# Patient Record
Sex: Female | Born: 1960 | Race: White | Hispanic: No | Marital: Married | Smoking: Former smoker
Health system: Southern US, Community
[De-identification: ages and names within clinical notes are randomized; demographics above are authoritative.]

## PROBLEM LIST (undated history)

## (undated) DIAGNOSIS — E785 Hyperlipidemia, unspecified: Secondary | ICD-10-CM

## (undated) DIAGNOSIS — Z87442 Personal history of urinary calculi: Secondary | ICD-10-CM

## (undated) DIAGNOSIS — D494 Neoplasm of unspecified behavior of bladder: Secondary | ICD-10-CM

## (undated) DIAGNOSIS — C50919 Malignant neoplasm of unspecified site of unspecified female breast: Secondary | ICD-10-CM

## (undated) DIAGNOSIS — C801 Malignant (primary) neoplasm, unspecified: Secondary | ICD-10-CM

## (undated) HISTORY — PX: TUBAL LIGATION: SHX77

## (undated) HISTORY — PX: KIDNEY CYST REMOVAL: SHX684

## (undated) HISTORY — DX: Malignant neoplasm of unspecified site of unspecified female breast: C50.919

---
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
## 2004-09-17 ENCOUNTER — Ambulatory Visit: Payer: Self-pay | Admitting: General Practice

## 2005-10-20 ENCOUNTER — Ambulatory Visit: Payer: Self-pay | Admitting: General Practice

## 2005-10-23 ENCOUNTER — Ambulatory Visit: Payer: Self-pay | Admitting: Endocrinology

## 2006-11-16 ENCOUNTER — Ambulatory Visit: Payer: Self-pay | Admitting: Endocrinology

## 2008-01-26 ENCOUNTER — Ambulatory Visit: Payer: Self-pay | Admitting: Obstetrics and Gynecology

## 2011-01-28 ENCOUNTER — Other Ambulatory Visit (HOSPITAL_COMMUNITY): Payer: Self-pay | Admitting: Obstetrics and Gynecology

## 2011-01-28 DIAGNOSIS — Z139 Encounter for screening, unspecified: Secondary | ICD-10-CM

## 2011-02-02 ENCOUNTER — Ambulatory Visit (HOSPITAL_COMMUNITY)
Admission: RE | Admit: 2011-02-02 | Discharge: 2011-02-02 | Disposition: A | Discharge status: Home / Self Care | Payer: 59 | Source: Ambulatory Visit | Attending: Obstetrics and Gynecology | Admitting: Obstetrics and Gynecology

## 2011-02-02 DIAGNOSIS — Z1231 Encounter for screening mammogram for malignant neoplasm of breast: Secondary | ICD-10-CM | POA: Insufficient documentation

## 2011-02-02 DIAGNOSIS — Z139 Encounter for screening, unspecified: Secondary | ICD-10-CM

## 2015-06-12 DIAGNOSIS — Z01419 Encounter for gynecological examination (general) (routine) without abnormal findings: Secondary | ICD-10-CM | POA: Diagnosis not present

## 2015-06-12 DIAGNOSIS — E7800 Pure hypercholesterolemia, unspecified: Secondary | ICD-10-CM | POA: Diagnosis not present

## 2015-06-12 DIAGNOSIS — Z1211 Encounter for screening for malignant neoplasm of colon: Secondary | ICD-10-CM | POA: Diagnosis not present

## 2015-06-28 DIAGNOSIS — E7800 Pure hypercholesterolemia, unspecified: Secondary | ICD-10-CM | POA: Diagnosis not present

## 2015-06-28 DIAGNOSIS — Z01419 Encounter for gynecological examination (general) (routine) without abnormal findings: Secondary | ICD-10-CM | POA: Diagnosis not present

## 2015-07-01 MED FILL — SIMVASTATIN 20 MG TABLET: 20 | 90 days supply | Qty: 90 | Fill #0

## 2015-09-30 MED FILL — SIMVASTATIN 20 MG TABLET: 20 | 90 days supply | Qty: 90 | Fill #1

## 2015-10-29 ENCOUNTER — Encounter: Payer: Self-pay | Admitting: *Deleted

## 2015-10-29 ENCOUNTER — Emergency Department: Payer: 59

## 2015-10-29 ENCOUNTER — Emergency Department
Admission: EM | Admit: 2015-10-29 | Discharge: 2015-10-29 | Disposition: A | Discharge status: Home / Self Care | Payer: 59 | Source: Home / Self Care | Attending: Emergency Medicine | Admitting: Emergency Medicine

## 2015-10-29 DIAGNOSIS — N132 Hydronephrosis with renal and ureteral calculous obstruction: Secondary | ICD-10-CM | POA: Diagnosis not present

## 2015-10-29 DIAGNOSIS — I10 Essential (primary) hypertension: Secondary | ICD-10-CM | POA: Insufficient documentation

## 2015-10-29 DIAGNOSIS — N136 Pyonephrosis: Secondary | ICD-10-CM | POA: Diagnosis not present

## 2015-10-29 DIAGNOSIS — B964 Proteus (mirabilis) (morganii) as the cause of diseases classified elsewhere: Secondary | ICD-10-CM | POA: Diagnosis not present

## 2015-10-29 DIAGNOSIS — Z87442 Personal history of urinary calculi: Secondary | ICD-10-CM | POA: Diagnosis not present

## 2015-10-29 DIAGNOSIS — N19 Unspecified kidney failure: Secondary | ICD-10-CM | POA: Diagnosis not present

## 2015-10-29 DIAGNOSIS — N202 Calculus of kidney with calculus of ureter: Secondary | ICD-10-CM | POA: Diagnosis not present

## 2015-10-29 DIAGNOSIS — R000 Tachycardia, unspecified: Secondary | ICD-10-CM | POA: Diagnosis not present

## 2015-10-29 DIAGNOSIS — A4189 Other specified sepsis: Secondary | ICD-10-CM | POA: Diagnosis not present

## 2015-10-29 DIAGNOSIS — I129 Hypertensive chronic kidney disease with stage 1 through stage 4 chronic kidney disease, or unspecified chronic kidney disease: Secondary | ICD-10-CM | POA: Diagnosis not present

## 2015-10-29 DIAGNOSIS — A419 Sepsis, unspecified organism: Secondary | ICD-10-CM | POA: Diagnosis not present

## 2015-10-29 DIAGNOSIS — N201 Calculus of ureter: Secondary | ICD-10-CM | POA: Diagnosis not present

## 2015-10-29 DIAGNOSIS — N183 Chronic kidney disease, stage 3 (moderate): Secondary | ICD-10-CM | POA: Diagnosis not present

## 2015-10-29 DIAGNOSIS — D494 Neoplasm of unspecified behavior of bladder: Secondary | ICD-10-CM | POA: Diagnosis not present

## 2015-10-29 DIAGNOSIS — N200 Calculus of kidney: Secondary | ICD-10-CM

## 2015-10-29 DIAGNOSIS — R51 Headache: Secondary | ICD-10-CM | POA: Diagnosis not present

## 2015-10-29 DIAGNOSIS — R1031 Right lower quadrant pain: Secondary | ICD-10-CM | POA: Diagnosis not present

## 2015-10-29 LAB — URINALYSIS COMPLETE WITH MICROSCOPIC (ARMC ONLY)
BILIRUBIN URINE: NEGATIVE
GLUCOSE, UA: NEGATIVE mg/dL
NITRITE: NEGATIVE
Protein, ur: 100 mg/dL — AB
Specific Gravity, Urine: 1.023 (ref 1.005–1.030)
pH: 5 (ref 5.0–8.0)

## 2015-10-29 MED ORDER — KETOROLAC TROMETHAMINE 30 MG/ML IJ SOLN
30.0000 mg | Freq: Once | INTRAMUSCULAR | Status: AC
  Administered 2015-10-29: 30 mg via INTRAVENOUS

## 2015-10-29 MED ORDER — SODIUM CHLORIDE 0.9 % IV BOLUS (SEPSIS)
1000.0000 mL | Freq: Once | INTRAVENOUS | Status: AC
  Administered 2015-10-29: 1000 mL via INTRAVENOUS

## 2015-10-29 MED ORDER — MORPHINE SULFATE (PF) 4 MG/ML IV SOLN
4.0000 mg | Freq: Once | INTRAVENOUS | Status: AC
  Administered 2015-10-29: 4 mg via INTRAVENOUS
  Filled 2015-10-29: qty 1

## 2015-10-29 MED ORDER — OXYCODONE-ACETAMINOPHEN 5-325 MG PO TABS
ORAL_TABLET | ORAL | Status: AC
  Administered 2015-10-29: 3 via ORAL
  Filled 2015-10-29: qty 3

## 2015-10-29 MED ORDER — KETOROLAC TROMETHAMINE 10 MG PO TABS: 10.0000 mg | ORAL_TABLET | Freq: Four times a day (QID) | ORAL | Status: DC | PRN

## 2015-10-29 MED ORDER — TAMSULOSIN HCL 0.4 MG PO CAPS: 0.4000 mg | ORAL_CAPSULE | Freq: Every day | ORAL | Status: DC

## 2015-10-29 MED ORDER — OXYCODONE-ACETAMINOPHEN 5-325 MG PO TABS: 1.0000 | ORAL_TABLET | ORAL | Status: DC | PRN

## 2015-10-29 MED ORDER — ONDANSETRON HCL 4 MG/2ML IJ SOLN
4.0000 mg | Freq: Once | INTRAMUSCULAR | Status: AC
  Administered 2015-10-29: 4 mg via INTRAVENOUS
  Filled 2015-10-29: qty 2

## 2015-10-29 MED ORDER — KETOROLAC TROMETHAMINE 30 MG/ML IJ SOLN
INTRAMUSCULAR | Status: AC
  Filled 2015-10-29: qty 1

## 2015-10-29 MED ORDER — MORPHINE SULFATE (PF) 4 MG/ML IV SOLN
4.0000 mg | Freq: Once | INTRAVENOUS | Status: AC
  Administered 2015-10-29: 4 mg via INTRAVENOUS

## 2015-10-29 MED ORDER — MORPHINE SULFATE (PF) 4 MG/ML IV SOLN
INTRAVENOUS | Status: AC
  Filled 2015-10-29: qty 1

## 2015-10-29 MED ORDER — OXYCODONE-ACETAMINOPHEN 5-325 MG PO TABS
3.0000 | ORAL_TABLET | Freq: Once | ORAL | Status: AC
  Administered 2015-10-29: 3 via ORAL

## 2015-10-29 NOTE — Discharge Instructions (Signed)
Kidney Stones Kidney stones (urolithiasis) are deposits that form inside your kidneys. The intense pain is caused by the stone moving through the urinary tract. When the stone moves, the ureter goes into spasm around the stone. The stone is usually passed in the urine.  CAUSES   A disorder that makes certain neck glands produce too much parathyroid hormone (primary hyperparathyroidism).  A buildup of uric acid crystals, similar to gout in your joints.  Narrowing (stricture) of the ureter.  A kidney obstruction present at birth (congenital obstruction).  Previous surgery on the kidney or ureters.  Numerous kidney infections. SYMPTOMS   Feeling sick to your stomach (nauseous).  Throwing up (vomiting).  Blood in the urine (hematuria).  Pain that usually spreads (radiates) to the groin.  Frequency or urgency of urination. DIAGNOSIS   Taking a history and physical exam.  Blood or urine tests.  CT scan.  Occasionally, an examination of the inside of the urinary bladder (cystoscopy) is performed. TREATMENT   Observation.  Increasing your fluid intake.  Extracorporeal shock wave lithotripsy--This is a noninvasive procedure that uses shock waves to break up kidney stones.  Surgery may be needed if you have severe pain or persistent obstruction. There are various surgical procedures. Most of the procedures are performed with the use of small instruments. Only small incisions are needed to accommodate these instruments, so recovery time is minimized. The size, location, and chemical composition are all important variables that will determine the proper choice of action for you. Talk to your health care provider to better understand your situation so that you will minimize the risk of injury to yourself and your kidney.  HOME CARE INSTRUCTIONS   Drink enough water and fluids to keep your urine clear or pale yellow. This will help you to pass the stone or stone fragments.  Strain  all urine through the provided strainer. Keep all particulate matter and stones for your health care provider to see. The stone causing the pain may be as small as a grain of salt. It is very important to use the strainer each and every time you pass your urine. The collection of your stone will allow your health care provider to analyze it and verify that a stone has actually passed. The stone analysis will often identify what you can do to reduce the incidence of recurrences.  Only take over-the-counter or prescription medicines for pain, discomfort, or fever as directed by your health care provider.  Keep all follow-up visits as told by your health care provider. This is important.  Get follow-up X-rays if required. The absence of pain does not always mean that the stone has passed. It may have only stopped moving. If the urine remains completely obstructed, it can cause loss of kidney function or even complete destruction of the kidney. It is your responsibility to make sure X-rays and follow-ups are completed. Ultrasounds of the kidney can show blockages and the status of the kidney. Ultrasounds are not associated with any radiation and can be performed easily in a matter of minutes.  Make changes to your daily diet as told by your health care provider. You may be told to:  Limit the amount of salt that you eat.  Eat 5 or more servings of fruits and vegetables each day.  Limit the amount of meat, poultry, fish, and eggs that you eat.  Collect a 24-hour urine sample as told by your health care provider.You may need to collect another urine sample every 6-12   months. SEEK MEDICAL CARE IF:  You experience pain that is progressive and unresponsive to any pain medicine you have been prescribed. SEEK IMMEDIATE MEDICAL CARE IF:   Pain cannot be controlled with the prescribed medicine.  You have a fever or shaking chills.  The severity or intensity of pain increases over 18 hours and is not  relieved by pain medicine.  You develop a new onset of abdominal pain.  You feel faint or pass out.  You are unable to urinate.   This information is not intended to replace advice given to you by your health care provider. Make sure you discuss any questions you have with your health care provider.   Document Released: 04/13/2005 Document Revised: 01/02/2015 Document Reviewed: 09/14/2012 Elsevier Interactive Patient Education 2016 [employer] Dietary Guidelines to Help Prevent Kidney Stones Your risk of kidney stones can be decreased by adjusting the foods you eat. The most important thing you can do is drink enough fluid. You should drink enough fluid to keep your urine clear or pale yellow. The following guidelines provide specific information for the type of kidney stone you have had. GUIDELINES ACCORDING TO TYPE OF KIDNEY STONE Calcium Oxalate Kidney Stones  Reduce the amount of salt you eat. Foods that have a lot of salt cause your body to release excess calcium into your urine. The excess calcium can combine with a substance called oxalate to form kidney stones.  Reduce the amount of animal protein you eat if the amount you eat is excessive. Animal protein causes your body to release excess calcium into your urine. Ask your dietitian how much protein from animal sources you should be eating.  Avoid foods that are high in oxalates. If you take vitamins, they should have less than 500 mg of vitamin C. Your body turns vitamin C into oxalates. You do not need to avoid fruits and vegetables high in vitamin C. Calcium Phosphate Kidney Stones  Reduce the amount of salt you eat to help prevent the release of excess calcium into your urine.  Reduce the amount of animal protein you eat if the amount you eat is excessive. Animal protein causes your body to release excess calcium into your urine. Ask your dietitian how much protein from animal sources you should be eating.  Get enough  calcium from food or take a calcium supplement (ask your dietitian for recommendations). Food sources of calcium that do not increase your risk of kidney stones include:  Broccoli.  Dairy products, such as cheese and yogurt.  Pudding. Uric Acid Kidney Stones  Do not have more than 6 oz of animal protein per day. FOOD SOURCES Animal Protein Sources  Meat (all types).  Poultry.  Eggs.  Fish, seafood. Foods High in Salt  Salt seasonings.  Soy sauce.  Teriyaki sauce.  Cured and processed meats.  Salted crackers and snack foods.  Fast food.  Canned soups and most canned foods. Foods High in Oxalates  Grains:  Amaranth.  Barley.  Grits.  Wheat germ.  Bran.  Buckwheat flour.  All bran cereals.  Pretzels.  Whole wheat bread.  Vegetables:  Beans (wax).  Beets and beet greens.  Collard greens.  Eggplant.  Escarole.  Leeks.  Okra.  Parsley.  Rutabagas.  Spinach.  Swiss chard.  Tomato paste.  Fried potatoes.  Sweet potatoes.  Fruits:  Red currants.  Figs.  Kiwi.  Rhubarb.  Meat and Other Protein Sources:  Beans (dried).  Soy burgers and other soybean products.  Miso.    Nuts (peanuts, almonds, pecans, cashews, hazelnuts).  Nut butters.  Sesame seeds and tahini (paste made of sesame seeds).  Poppy seeds.  Beverages:  Chocolate drink mixes.  Soy milk.  Instant iced tea.  Juices made from high-oxalate fruits or vegetables.  Other:  Carob.  Chocolate.  Fruitcake.  Marmalades.   This information is not intended to replace advice given to you by your health care provider. Make sure you discuss any questions you have with your health care provider.   Document Released: 08/08/2010 Document Revised: 04/18/2013 Document Reviewed: 03/10/2013 Elsevier Interactive Patient Education 2016 Elsevier Inc.  

## 2015-10-29 NOTE — ED Notes (Signed)
States right flank pain that began today around 11 am, states odorous urine, denies any hx of kidney stones, states urinary urgency and dribbiling

## 2015-10-29 NOTE — ED Provider Notes (Signed)
[facility] Emergency Department Provider Note  ____________________________________________  Time seen: Approximately 6:25 PM  I have reviewed the triage vital signs and the nursing notes.   HISTORY  Chief Complaint Flank Pain    HPI Rhonda Schneider is a 55 y.o. female who presents emergency department complaining of right flank pain 1 day. Patient states that the pain is sharp, constant, severe. Patient denies any history of kidney stones. Patient reports that she does have urinary urgency but has decreased volume each time. Patient denies any fevers or chills, abdominal pain. Patient has seen some blood in the urine. Patient is postmenopausal and sexually active only with her husband.   History reviewed. No pertinent past medical history.  There are no active problems to display for this patient.   No past surgical history on file.  Current Outpatient Rx  Name  Route  Sig  Dispense  Refill  . ketorolac (TORADOL) 10 MG tablet   Oral   Take 1 tablet (10 mg total) by mouth every 6 (six) hours as needed.   20 tablet   0   . oxyCODONE-acetaminophen (ROXICET) 5-325 MG tablet   Oral   Take 1 tablet by mouth every 4 (four) hours as needed for severe pain.   24 tablet   0   . tamsulosin (FLOMAX) 0.4 MG CAPS capsule   Oral   Take 1 capsule (0.4 mg total) by mouth daily.   15 capsule   0     Allergies Review of patient's allergies indicates no known allergies.  History reviewed. No pertinent family history.  Social History Social History  Substance Use Topics  . Smoking status: None  . Smokeless tobacco: None  . Alcohol Use: None     Review of Systems  Constitutional: No fever/chills Cardiovascular: no chest pain. Respiratory: no cough. No SOB. Gastrointestinal: No abdominal pain.  No nausea, no vomiting.  No diarrhea.  No constipation. Genitourinary: Negative for dysuria. Positive for polyuria. Positive for hematuria. Positive for  right-sided flank pain. Musculoskeletal: Negative for musculoskeletal pain. Skin: Negative for rash, abrasions, lacerations, ecchymosis. Neurological: Negative for headaches, focal weakness or numbness. 10-point ROS otherwise negative.  ____________________________________________   PHYSICAL EXAM:  VITAL SIGNS: ED Triage Vitals  Enc Vitals Group     BP 10/29/15 1753 154/78 mmHg     Pulse Rate 10/29/15 1753 83     Resp 10/29/15 1753 18     Temp 10/29/15 1753 98.2 F (36.8 C)     Temp Source 10/29/15 1753 Oral     SpO2 10/29/15 1753 98 %     Weight 10/29/15 1753 239 lb (108.41 kg)     Height 10/29/15 1753 5\' 5"  (1.651 m)     Head Cir --      Peak Flow --      Pain Score 10/29/15 1754 8     Pain Loc --      Pain Edu? --      Excl. in [place]? --      Constitutional: Alert and oriented. Well appearing and in no acute distress. Eyes: Conjunctivae are normal. PERRL. EOMI. Head: Atraumatic. Cardiovascular: Normal rate, regular rhythm. Normal S1 and S2.  Good peripheral circulation. Respiratory: Normal respiratory effort without tachypnea or retractions. Lungs CTAB. Good air entry to the bases with no decreased or absent breath sounds. Gastrointestinal: Bowel sounds 4 quadrants. Soft and nontender to palpation. No guarding or rigidity. No palpable masses. No distention. No CVA tenderness. Musculoskeletal: Full range  of motion to all extremities. No gross deformities appreciated. Neurologic:  Normal speech and language. No gross focal neurologic deficits are appreciated.  Skin:  Skin is warm, dry and intact. No rash noted. Psychiatric: Mood and affect are normal. Speech and behavior are normal. Patient exhibits appropriate insight and judgement.   ____________________________________________   LABS (all labs ordered are listed, but only abnormal results are displayed)  Labs Reviewed  URINALYSIS COMPLETEWITH MICROSCOPIC ([place]) - Abnormal; Notable for the following:     Color, Urine YELLOW (*)    APPearance CLOUDY (*)    Ketones, ur TRACE (*)    Hgb urine dipstick 3+ (*)    Protein, ur 100 (*)    Leukocytes, UA 3+ (*)    Bacteria, UA RARE (*)    Squamous Epithelial / LPF 0-5 (*)    All other components within normal limits   ____________________________________________  EKG   ____________________________________________  RADIOLOGY Diamantina Providence N[place] Chard, personally viewed and evaluated these images as part of my medical decision making, as well as reviewing the written report by the radiologist.  Ct Abdomen Pelvis Wo Contrast  10/29/2015  CLINICAL DATA:  Right flank pain for 1 day EXAM: CT ABDOMEN AND PELVIS WITHOUT CONTRAST TECHNIQUE: Multidetector CT imaging of the abdomen and pelvis was performed following the standard protocol without oral or intravenous contrast material administration. COMPARISON:  None. FINDINGS: Lower chest:  Lung bases are clear. Hepatobiliary: No focal liver lesions are identified on this noncontrast enhanced study. Gallbladder wall is not appreciably thickened. There is no biliary duct dilatation. Pancreas: There is no pancreatic mass or inflammatory focus. Spleen: No splenic lesions are evident. Adrenals/Urinary Tract: Adrenals appear normal bilaterally. There is no renal mass on either side. There is no hydronephrosis on the left. There is moderate hydronephrosis on the right. There is no intrarenal calculus on either side. There is a calculus at the right ureteropelvic junction measuring 6 x 5 mm. No other ureteral calculi are apparent on either side. Urinary bladder is midline with wall thickness within normal limits. Stomach/Bowel: There is no appreciable bowel wall or mesenteric thickening. No bowel obstruction. No free air or portal venous air. Vascular/Lymphatic: There are scattered foci of atherosclerotic calcification in the aorta. The major mesenteric vessels appear patent on this noncontrast enhanced study. There is no  appreciable adenopathy in the abdomen or pelvis. Reproductive: Uterus is anteverted. There is no pelvic mass or pelvic fluid collection. Other: Appendix appears unremarkable. There is no abscess or ascites in the abdomen or pelvis. Musculoskeletal: There is degenerative change at L5-S1 with vacuum phenomenon at this level. There is degenerative change at the pubic symphysis level as well. There is degenerative change in the left hip joint with subchondral cystic change. There are no blastic or lytic bone lesions. There is no intramuscular or abdominal wall lesion. IMPRESSION: 6 x 5 mm calculus at the right ureteropelvic junction with moderate hydronephrosis on the right. No bowel obstruction.  No abscess.  Appendix region appears normal. Areas of aortic atherosclerosis. Electronically Signed   By: Lowella Grip III M.D.   On: 10/29/2015 19:24    ____________________________________________    PROCEDURES  Procedure(s) performed:       Medications  sodium chloride 0.9 % bolus 1,000 mL (1,000 mLs Intravenous New Bag/Given 10/29/15 1845)  ondansetron (ZOFRAN) injection 4 mg (4 mg Intravenous Given 10/29/15 1846)  morphine 4 MG/ML injection 4 mg (4 mg Intravenous Given 10/29/15 1847)  ketorolac (TORADOL) 30 MG/ML injection 30  mg (30 mg Intravenous Given 10/29/15 1946)  morphine 4 MG/ML injection 4 mg (4 mg Intravenous Given 10/29/15 1950)     ____________________________________________   INITIAL IMPRESSION / ASSESSMENT AND PLAN / ED COURSE  Pertinent labs & imaging results that were available during my care of the patient were reviewed by me and considered in my medical decision making (see chart for details).  Patient's diagnosis is consistent with Kidney stone. Patient's history was concerning for kidney stone and CT was ordered. Patient's CT scan reveals the above diagnosis. She continues to have significant pain. She has received 2 doses of morphine and Toradol after confirming there was no  obstructing calculus. This improves symptoms. Patient will be discharged home with prescriptions for flomax, anti-inflammatory, and pain medicine. Patient is to follow up with urology. Patient is given ED precautions to return to the ED for any worsening or new symptoms.   Due to the holiday, no pharmacies are open. Patient calls back requesting advice on how to treat pain. Patient will be given Percocet for pain relief until pharmacy opens in the morning.   ____________________________________________  FINAL CLINICAL IMPRESSION(S) / ED DIAGNOSES  Final diagnoses:  Kidney stones      NEW MEDICATIONS STARTED DURING THIS VISIT:  New Prescriptions   KETOROLAC (TORADOL) 10 MG TABLET    Take 1 tablet (10 mg total) by mouth every 6 (six) hours as needed.   OXYCODONE-ACETAMINOPHEN (ROXICET) 5-325 MG TABLET    Take 1 tablet by mouth every 4 (four) hours as needed for severe pain.   TAMSULOSIN (FLOMAX) 0.4 MG CAPS CAPSULE    Take 1 capsule (0.4 mg total) by mouth daily.        This chart was dictated using voice recognition software/Dragon. Despite best efforts to proofread, errors can occur which can change the meaning. Any change was purely unintentional.    Darletta Moll, PA-C 10/29/15 2018  Charline Bills Cian Costanzo, PA-C 10/29/15 2101  Schuyler Amor, MD 10/29/15 [phone]

## 2015-10-31 ENCOUNTER — Inpatient Hospital Stay: Payer: 59 | Admitting: Certified Registered"

## 2015-10-31 ENCOUNTER — Inpatient Hospital Stay
Admission: EM | Admit: 2015-10-31 | Discharge: 2015-11-03 | DRG: 872 | Disposition: A | Discharge status: Home / Self Care | Payer: 59 | Attending: Internal Medicine | Admitting: Internal Medicine

## 2015-10-31 ENCOUNTER — Emergency Department: Payer: 59

## 2015-10-31 ENCOUNTER — Encounter
Admission: EM | Disposition: A | Discharge status: Home / Self Care | Payer: Self-pay | Source: Home / Self Care | Attending: Internal Medicine

## 2015-10-31 ENCOUNTER — Encounter: Payer: Self-pay | Admitting: Emergency Medicine

## 2015-10-31 DIAGNOSIS — N183 Chronic kidney disease, stage 3 (moderate): Secondary | ICD-10-CM | POA: Diagnosis present

## 2015-10-31 DIAGNOSIS — N10 Acute tubulo-interstitial nephritis: Secondary | ICD-10-CM | POA: Diagnosis present

## 2015-10-31 DIAGNOSIS — R51 Headache: Secondary | ICD-10-CM

## 2015-10-31 DIAGNOSIS — R000 Tachycardia, unspecified: Secondary | ICD-10-CM | POA: Diagnosis present

## 2015-10-31 DIAGNOSIS — N136 Pyonephrosis: Secondary | ICD-10-CM | POA: Diagnosis present

## 2015-10-31 DIAGNOSIS — N201 Calculus of ureter: Secondary | ICD-10-CM

## 2015-10-31 DIAGNOSIS — A419 Sepsis, unspecified organism: Secondary | ICD-10-CM

## 2015-10-31 DIAGNOSIS — D494 Neoplasm of unspecified behavior of bladder: Secondary | ICD-10-CM | POA: Diagnosis present

## 2015-10-31 DIAGNOSIS — N19 Unspecified kidney failure: Secondary | ICD-10-CM | POA: Diagnosis present

## 2015-10-31 DIAGNOSIS — N200 Calculus of kidney: Secondary | ICD-10-CM | POA: Diagnosis not present

## 2015-10-31 DIAGNOSIS — R112 Nausea with vomiting, unspecified: Secondary | ICD-10-CM

## 2015-10-31 DIAGNOSIS — A4189 Other specified sepsis: Principal | ICD-10-CM | POA: Diagnosis present

## 2015-10-31 DIAGNOSIS — R197 Diarrhea, unspecified: Secondary | ICD-10-CM

## 2015-10-31 DIAGNOSIS — B964 Proteus (mirabilis) (morganii) as the cause of diseases classified elsewhere: Secondary | ICD-10-CM | POA: Diagnosis present

## 2015-10-31 DIAGNOSIS — R109 Unspecified abdominal pain: Secondary | ICD-10-CM

## 2015-10-31 DIAGNOSIS — Z79899 Other long term (current) drug therapy: Secondary | ICD-10-CM | POA: Diagnosis not present

## 2015-10-31 DIAGNOSIS — N390 Urinary tract infection, site not specified: Secondary | ICD-10-CM | POA: Diagnosis not present

## 2015-10-31 DIAGNOSIS — R7881 Bacteremia: Secondary | ICD-10-CM | POA: Diagnosis not present

## 2015-10-31 DIAGNOSIS — N1330 Unspecified hydronephrosis: Secondary | ICD-10-CM | POA: Diagnosis not present

## 2015-10-31 DIAGNOSIS — N179 Acute kidney failure, unspecified: Secondary | ICD-10-CM | POA: Diagnosis not present

## 2015-10-31 DIAGNOSIS — I129 Hypertensive chronic kidney disease with stage 1 through stage 4 chronic kidney disease, or unspecified chronic kidney disease: Secondary | ICD-10-CM | POA: Diagnosis present

## 2015-10-31 DIAGNOSIS — Z87442 Personal history of urinary calculi: Secondary | ICD-10-CM | POA: Diagnosis not present

## 2015-10-31 DIAGNOSIS — R1031 Right lower quadrant pain: Secondary | ICD-10-CM | POA: Diagnosis not present

## 2015-10-31 DIAGNOSIS — R519 Headache, unspecified: Secondary | ICD-10-CM

## 2015-10-31 HISTORY — PX: CYSTOSCOPY WITH STENT PLACEMENT: SHX5790

## 2015-10-31 LAB — CBC WITH DIFFERENTIAL/PLATELET
BASOS ABS: 0 10*3/uL (ref 0–0.1)
BASOS PCT: 0 %
EOS ABS: 0 10*3/uL (ref 0–0.7)
EOS PCT: 0 %
HCT: 40 % (ref 35.0–47.0)
Hemoglobin: 13.8 g/dL (ref 12.0–16.0)
Lymphocytes Relative: 2 %
Lymphs Abs: 0.3 10*3/uL — ABNORMAL LOW (ref 1.0–3.6)
MCH: 30.4 pg (ref 26.0–34.0)
MCHC: 34.5 g/dL (ref 32.0–36.0)
MCV: 88.1 fL (ref 80.0–100.0)
MONO ABS: 0.4 10*3/uL (ref 0.2–0.9)
Monocytes Relative: 3 %
Neutro Abs: 12.8 10*3/uL — ABNORMAL HIGH (ref 1.4–6.5)
Neutrophils Relative %: 95 %
PLATELETS: 110 10*3/uL — AB (ref 150–440)
RBC: 4.53 MIL/uL (ref 3.80–5.20)
RDW: 12.8 % (ref 11.5–14.5)
WBC: 13.5 10*3/uL — AB (ref 3.6–11.0)

## 2015-10-31 LAB — COMPREHENSIVE METABOLIC PANEL
ALBUMIN: 3.7 g/dL (ref 3.5–5.0)
ALT: 30 U/L (ref 14–54)
AST: 41 U/L (ref 15–41)
Alkaline Phosphatase: 86 U/L (ref 38–126)
Anion gap: 8 (ref 5–15)
BUN: 26 mg/dL — AB (ref 6–20)
CHLORIDE: 99 mmol/L — AB (ref 101–111)
CO2: 24 mmol/L (ref 22–32)
Calcium: 8.8 mg/dL — ABNORMAL LOW (ref 8.9–10.3)
Creatinine, Ser: 1.44 mg/dL — ABNORMAL HIGH (ref 0.44–1.00)
GFR calc Af Amer: 47 mL/min — ABNORMAL LOW (ref >60)
GFR, EST NON AFRICAN AMERICAN: 40 mL/min — AB (ref >60)
GLUCOSE: 154 mg/dL — AB (ref 65–99)
POTASSIUM: 3.6 mmol/L (ref 3.5–5.1)
SODIUM: 131 mmol/L — AB (ref 135–145)
Total Bilirubin: 1.1 mg/dL (ref 0.3–1.2)
Total Protein: 6.7 g/dL (ref 6.5–8.1)

## 2015-10-31 LAB — URINALYSIS COMPLETE WITH MICROSCOPIC (ARMC ONLY)
BILIRUBIN URINE: NEGATIVE
Bacteria, UA: NONE SEEN
Glucose, UA: NEGATIVE mg/dL
Ketones, ur: NEGATIVE mg/dL
Nitrite: NEGATIVE
PH: 7 (ref 5.0–8.0)
Protein, ur: 30 mg/dL — AB
Specific Gravity, Urine: 1.008 (ref 1.005–1.030)

## 2015-10-31 LAB — LIPASE, BLOOD: LIPASE: 20 U/L (ref 11–51)

## 2015-10-31 LAB — PREGNANCY, URINE
PREG TEST UR: NEGATIVE
Preg Test, Ur: NEGATIVE

## 2015-10-31 LAB — LACTIC ACID, PLASMA
Lactic Acid, Venous: 1.2 mmol/L (ref 0.5–1.9)
Lactic Acid, Venous: 0.9 mmol/L (ref 0.5–1.9)

## 2015-10-31 SURGERY — CYSTOSCOPY, WITH STENT INSERTION
Anesthesia: General | Laterality: Right | Wound class: Clean Contaminated

## 2015-10-31 MED ORDER — FAMOTIDINE IN NACL 20-0.9 MG/50ML-% IV SOLN
20.0000 mg | Freq: Two times a day (BID) | INTRAVENOUS | Status: DC
  Administered 2015-10-31 – 2015-11-01 (×3): 20 mg via INTRAVENOUS
  Filled 2015-10-31 (×5): qty 50

## 2015-10-31 MED ORDER — MORPHINE SULFATE (PF) 4 MG/ML IV SOLN
4.0000 mg | Freq: Once | INTRAVENOUS | Status: AC
  Administered 2015-10-31: 4 mg via INTRAVENOUS
  Filled 2015-10-31: qty 1

## 2015-10-31 MED ORDER — FENTANYL CITRATE (PF) 100 MCG/2ML IJ SOLN: 25.0000 ug | INTRAMUSCULAR | Status: DC | PRN

## 2015-10-31 MED ORDER — ONDANSETRON HCL 4 MG/2ML IJ SOLN
INTRAMUSCULAR | Status: DC | PRN
  Administered 2015-10-31: 4 mg via INTRAVENOUS

## 2015-10-31 MED ORDER — ROCURONIUM BROMIDE 100 MG/10ML IV SOLN
INTRAVENOUS | Status: DC | PRN
  Administered 2015-10-31: 5 mg via INTRAVENOUS
  Administered 2015-10-31: 20 mg via INTRAVENOUS

## 2015-10-31 MED ORDER — PHENYLEPHRINE HCL 10 MG/ML IJ SOLN
INTRAMUSCULAR | Status: DC | PRN
  Administered 2015-10-31 (×2): 200 ug via INTRAVENOUS
  Administered 2015-10-31: 100 ug via INTRAVENOUS
  Administered 2015-10-31: 200 ug via INTRAVENOUS

## 2015-10-31 MED ORDER — FENTANYL CITRATE (PF) 100 MCG/2ML IJ SOLN
INTRAMUSCULAR | Status: DC | PRN
  Administered 2015-10-31 (×2): 50 ug via INTRAVENOUS

## 2015-10-31 MED ORDER — PROPOFOL 10 MG/ML IV BOLUS
INTRAVENOUS | Status: DC | PRN
  Administered 2015-10-31: 170 mg via INTRAVENOUS

## 2015-10-31 MED ORDER — DEXTROSE 5 % IV SOLN
1.0000 g | INTRAVENOUS | Status: DC
  Filled 2015-10-31: qty 10

## 2015-10-31 MED ORDER — SODIUM CHLORIDE 0.9 % IV SOLN
INTRAVENOUS | Status: DC
  Administered 2015-10-31 – 2015-11-02 (×6): via INTRAVENOUS

## 2015-10-31 MED ORDER — SUCCINYLCHOLINE CHLORIDE 20 MG/ML IJ SOLN
INTRAMUSCULAR | Status: DC | PRN
  Administered 2015-10-31: 100 mg via INTRAVENOUS

## 2015-10-31 MED ORDER — SODIUM CHLORIDE 0.9 % IV BOLUS (SEPSIS)
1000.0000 mL | Freq: Once | INTRAVENOUS | Status: AC
  Administered 2015-10-31: 1000 mL via INTRAVENOUS

## 2015-10-31 MED ORDER — CIPROFLOXACIN HCL 500 MG PO TABS: 500.0000 mg | ORAL_TABLET | Freq: Once | ORAL | Status: DC

## 2015-10-31 MED ORDER — ONDANSETRON HCL 4 MG PO TABS
4.0000 mg | ORAL_TABLET | Freq: Four times a day (QID) | ORAL | Status: DC | PRN
Start: 2015-10-31 — End: 2015-11-03

## 2015-10-31 MED ORDER — ONDANSETRON HCL 4 MG/2ML IJ SOLN
INTRAMUSCULAR | Status: AC
  Administered 2015-10-31: 4 mg via INTRAVENOUS
  Filled 2015-10-31: qty 2

## 2015-10-31 MED ORDER — SUGAMMADEX SODIUM 200 MG/2ML IV SOLN
INTRAVENOUS | Status: DC | PRN
  Administered 2015-10-31: 220 mg via INTRAVENOUS

## 2015-10-31 MED ORDER — HEPARIN SODIUM (PORCINE) 5000 UNIT/ML IJ SOLN
5000.0000 [IU] | Freq: Three times a day (TID) | INTRAMUSCULAR | Status: DC
  Administered 2015-10-31 – 2015-11-03 (×8): 5000 [IU] via SUBCUTANEOUS
  Filled 2015-10-31 (×8): qty 1

## 2015-10-31 MED ORDER — ONDANSETRON HCL 4 MG/2ML IJ SOLN
4.0000 mg | Freq: Four times a day (QID) | INTRAMUSCULAR | Status: DC | PRN
  Administered 2015-10-31: 4 mg via INTRAVENOUS

## 2015-10-31 MED ORDER — DEXAMETHASONE SODIUM PHOSPHATE 10 MG/ML IJ SOLN
INTRAMUSCULAR | Status: DC | PRN
  Administered 2015-10-31: 10 mg via INTRAVENOUS

## 2015-10-31 MED ORDER — LIDOCAINE HCL (CARDIAC) 20 MG/ML IV SOLN
INTRAVENOUS | Status: DC | PRN
  Administered 2015-10-31: 60 mg via INTRAVENOUS

## 2015-10-31 MED ORDER — SIMVASTATIN 40 MG PO TABS
20.0000 mg | ORAL_TABLET | Freq: Every day | ORAL | Status: DC
  Administered 2015-10-31 – 2015-11-01 (×2): 20 mg via ORAL
  Administered 2015-11-02: 40 mg via ORAL
  Filled 2015-10-31 (×3): qty 1

## 2015-10-31 MED ORDER — LACTATED RINGERS IV SOLN
INTRAVENOUS | Status: DC
  Administered 2015-10-31: 100 mL/h via INTRAVENOUS
  Administered 2015-10-31: 15:00:00 via INTRAVENOUS

## 2015-10-31 MED ORDER — MORPHINE SULFATE (PF) 2 MG/ML IV SOLN
INTRAVENOUS | Status: AC
  Administered 2015-10-31: 2 mg via INTRAVENOUS
  Filled 2015-10-31: qty 1

## 2015-10-31 MED ORDER — METOCLOPRAMIDE HCL 5 MG/ML IJ SOLN
INTRAMUSCULAR | Status: AC
  Administered 2015-10-31: 10 mg via INTRAVENOUS
  Filled 2015-10-31: qty 2

## 2015-10-31 MED ORDER — MIDAZOLAM HCL 2 MG/2ML IJ SOLN
INTRAMUSCULAR | Status: DC | PRN
  Administered 2015-10-31: 2 mg via INTRAVENOUS

## 2015-10-31 MED ORDER — ONDANSETRON HCL 4 MG/2ML IJ SOLN: 4.0000 mg | Freq: Once | INTRAMUSCULAR | Status: DC | PRN

## 2015-10-31 MED ORDER — ONDANSETRON 4 MG PO TBDP: 4.0000 mg | ORAL_TABLET | Freq: Once | ORAL | Status: DC

## 2015-10-31 MED ORDER — ACETAMINOPHEN 325 MG PO TABS: 650.0000 mg | ORAL_TABLET | Freq: Four times a day (QID) | ORAL | Status: DC | PRN

## 2015-10-31 MED ORDER — TAMSULOSIN HCL 0.4 MG PO CAPS
0.4000 mg | ORAL_CAPSULE | Freq: Every day | ORAL | Status: DC
  Administered 2015-11-01 – 2015-11-03 (×3): 0.4 mg via ORAL
  Filled 2015-10-31 (×3): qty 1

## 2015-10-31 MED ORDER — KETOROLAC TROMETHAMINE 30 MG/ML IJ SOLN
15.0000 mg | Freq: Once | INTRAMUSCULAR | Status: AC
  Administered 2015-10-31: 15 mg via INTRAVENOUS
  Filled 2015-10-31: qty 1

## 2015-10-31 MED ORDER — ACETAMINOPHEN 650 MG RE SUPP: 650.0000 mg | Freq: Four times a day (QID) | RECTAL | Status: DC | PRN

## 2015-10-31 MED ORDER — DIPHENHYDRAMINE HCL 50 MG/ML IJ SOLN
12.5000 mg | Freq: Once | INTRAMUSCULAR | Status: AC
  Administered 2015-10-31: 12.5 mg via INTRAVENOUS

## 2015-10-31 MED ORDER — DIPHENHYDRAMINE HCL 50 MG/ML IJ SOLN
INTRAMUSCULAR | Status: AC
  Administered 2015-10-31: 12.5 mg via INTRAVENOUS
  Filled 2015-10-31: qty 1

## 2015-10-31 MED ORDER — METOCLOPRAMIDE HCL 5 MG/ML IJ SOLN
10.0000 mg | Freq: Once | INTRAMUSCULAR | Status: AC
  Administered 2015-10-31: 10 mg via INTRAVENOUS

## 2015-10-31 MED ORDER — MORPHINE SULFATE (PF) 2 MG/ML IV SOLN
2.0000 mg | INTRAVENOUS | Status: DC | PRN
  Administered 2015-10-31: 2 mg via INTRAVENOUS

## 2015-10-31 MED ORDER — DEXTROSE 5 % IV SOLN
1.0000 g | Freq: Once | INTRAVENOUS | Status: AC
  Administered 2015-10-31: 1 g via INTRAVENOUS
  Filled 2015-10-31: qty 10

## 2015-10-31 SURGICAL SUPPLY — 21 items
BACTOSHIELD CHG 4% 4OZ (MISCELLANEOUS) ×2
BAG URINE DRAINAGE (UROLOGICAL SUPPLIES) ×3 IMPLANT
CATH FOLEY 2WAY  5CC 16FR (CATHETERS) ×2
CATH URETL 5X70 OPEN END (CATHETERS) ×3 IMPLANT
CATH URTH 16FR FL 2W BLN LF (CATHETERS) ×1 IMPLANT
GLOVE BIO SURGEON STRL SZ7.5 (GLOVE) ×3 IMPLANT
GOWN STRL REUS W/ TWL LRG LVL4 (GOWN DISPOSABLE) ×1 IMPLANT
GOWN STRL REUS W/TWL LRG LVL4 (GOWN DISPOSABLE) ×2
GOWN STRL REUS W/TWL XL LVL3 (GOWN DISPOSABLE) ×3 IMPLANT
KIT RM TURNOVER CYSTO AR (KITS) ×3 IMPLANT
PACK CYSTO AR (MISCELLANEOUS) ×3 IMPLANT
SCRUB CHG 4% DYNA-HEX 4OZ (MISCELLANEOUS) ×1 IMPLANT
SENSORWIRE 0.038 NOT ANGLED (WIRE) ×3
SET CYSTO W/LG BORE CLAMP LF (SET/KITS/TRAYS/PACK) ×3 IMPLANT
SOL .9 NS 3000ML IRR  AL (IV SOLUTION) ×2
SOL .9 NS 3000ML IRR UROMATIC (IV SOLUTION) ×1 IMPLANT
STENT URET 6FRX24 CONTOUR (STENTS) IMPLANT
STENT URET 6FRX26 CONTOUR (STENTS) ×3 IMPLANT
SURGILUBE 2OZ TUBE FLIPTOP (MISCELLANEOUS) ×3 IMPLANT
WATER STERILE IRR 1000ML POUR (IV SOLUTION) ×3 IMPLANT
WIRE SENSOR 0.038 NOT ANGLED (WIRE) ×1 IMPLANT

## 2015-10-31 NOTE — Transfer of Care (Signed)
Immediate Anesthesia Transfer of Care Note  Patient: Rhonda Schneider  Procedure(s) Performed: Procedure(s): CYSTOSCOPY WITH STENT PLACEMENT (Right)  Patient Location: PACU  Anesthesia Type:General  Level of Consciousness: awake, alert , oriented and patient cooperative  Airway & Oxygen Therapy: Patient Spontanous Breathing and Patient connected to face mask oxygen  Post-op Assessment: Report given to RN, Post -op Vital signs reviewed and stable and Patient moving all extremities X 4  Post vital signs: Reviewed and stable  Last Vitals:  Filed Vitals:   10/31/15 1333 10/31/15 1424  BP:  129/71  Pulse:  111  Temp: 36.9 C 39.3 C  Resp:  16    Last Pain:  Filed Vitals:   10/31/15 1531  PainSc: 7       Patients Stated Pain Goal: 1 ([id number])  Complications: No apparent anesthesia complications

## 2015-10-31 NOTE — Anesthesia Procedure Notes (Signed)
Procedure Name: Intubation Date/Time: 10/31/2015 2:51 PM Performed by: Silvana Newness Pre-anesthesia Checklist: Patient identified, Emergency Drugs available, Suction available, Patient being monitored and Timeout performed Patient Re-evaluated:Patient Re-evaluated prior to inductionOxygen Delivery Method: Circle system utilized Preoxygenation: Pre-oxygenation with 100% oxygen Intubation Type: IV induction, Cricoid Pressure applied and Rapid sequence Ventilation: Oral airway inserted - appropriate to patient size and Two handed mask ventilation required Laryngoscope Size: Mac and 3 Grade View: Grade I Tube type: Oral Tube size: 7.0 mm Number of attempts: 1 Airway Equipment and Method: Rigid stylet and Patient positioned with wedge pillow Placement Confirmation: ETT inserted through vocal cords under direct vision,  positive ETCO2 and breath sounds checked- equal and bilateral Secured at: 20 cm Tube secured with: Tape Dental Injury: Teeth and Oropharynx as per pre-operative assessment

## 2015-10-31 NOTE — ED Notes (Signed)
Patient transported to CT 

## 2015-10-31 NOTE — ED Notes (Signed)
Patient presents to the ED with nausea, vomiting, and severe headache since Tuesday when she was seen for new onset kidney stone.  Patient has been taking flomax and toradol.  Patient reports inability to eat without vomiting.  Patient reports vomiting about every 2-3 hours.  Patient also reports four episodes of diarrhea this morning.  Patient is moaning during triage and appears uncomfortable.

## 2015-10-31 NOTE — Consult Note (Signed)
12:45 PM   Rhonda Schneider 06/21/1960 [id number]  Referring provider: Dr. Jones Broom  Chief Complaint  Patient presents with  . Emesis  . Headache  . Flank Pain    HPI: The patient is a 55 year old female with no past GU history who presents with right flank pain. Previously 2 days ago she was diagnosed with a right 6 mm UPJ stone. She presents again today with right flank pain that has worsened. She also is tachycardic to 127. Her white blood cell count is 13. Urinalysis was concerning for infection. Urology was consulted for further patient regarding her care. She is currently being admitted to the hospitalist service. She has never had stone before.     PMH: History reviewed. No pertinent past medical history.  Surgical History: History reviewed. No pertinent past surgical history.  Home Medications:    Medication List    ASK your doctor about these medications        ketorolac 10 MG tablet  Commonly known as:  TORADOL  Take 1 tablet (10 mg total) by mouth every 6 (six) hours as needed.     oxyCODONE-acetaminophen 5-325 MG tablet  Commonly known as:  ROXICET  Take 1 tablet by mouth every 4 (four) hours as needed for severe pain.     simvastatin 20 MG tablet  Commonly known as:  ZOCOR  Take 20 mg by mouth at bedtime.     tamsulosin 0.4 MG Caps capsule  Commonly known as:  FLOMAX  Take 1 capsule (0.4 mg total) by mouth daily.        Allergies: No Known Allergies  Family History: No family history on file.  Social History:  reports that she has never smoked. She does not have any smokeless tobacco history on file. She reports that she does not drink alcohol. Her drug history is not on file.  ROS: 12 point ROS negative except for above                                        Physical Exam: BP 118/89 mmHg  Pulse 127  Temp(Src) 98.6 F (37 C) (Oral)  Resp 21  Ht 5\' 6"  (1.676 m)  Wt 239 lb (108.41 kg)  BMI 38.59 kg/m2  SpO2  96%  Constitutional:  Alert and oriented, No acute distress. HEENT: [place] AT, moist mucus membranes.  Trachea midline, no masses. Cardiovascular: No clubbing, cyanosis, or edema. Respiratory: Normal respiratory effort, no increased work of breathing. GI: Abdomen is soft, nontender, nondistended, no abdominal masses GU: Right CVA TTP Skin: No rashes, bruises or suspicious lesions. Lymph: No cervical or inguinal adenopathy. Neurologic: Grossly intact, no focal deficits, moving all 4 extremities. Psychiatric: Normal mood and affect.  Laboratory Data: Lab Results  Component Value Date   WBC 13.5* 10/31/2015   HGB 13.8 10/31/2015   HCT 40.0 10/31/2015   MCV 88.1 10/31/2015   PLT 110* 10/31/2015    Lab Results  Component Value Date   CREATININE 1.44* 10/31/2015    No results found for: PSA  No results found for: TESTOSTERONE  No results found for: HGBA1C  Urinalysis    Component Value Date/Time   COLORURINE YELLOW* 10/31/2015 1104   APPEARANCEUR HAZY* 10/31/2015 1104   LABSPEC 1.008 10/31/2015 1104   PHURINE 7.0 10/31/2015 1104   GLUCOSEU NEGATIVE 10/31/2015 1104   HGBUR 2+* 10/31/2015 1104   BILIRUBINUR  NEGATIVE 10/31/2015 [place] 10/31/2015 1104   PROTEINUR 30* 10/31/2015 1104   NITRITE NEGATIVE 10/31/2015 1104   LEUKOCYTESUR 3+* 10/31/2015 1104    Pertinent Imaging: CLINICAL DATA: Right flank pain for 1 day  EXAM: CT ABDOMEN AND PELVIS WITHOUT CONTRAST  TECHNIQUE: Multidetector CT imaging of the abdomen and pelvis was performed following the standard protocol without oral or intravenous contrast material administration.  COMPARISON: None.  FINDINGS: Lower chest: Lung bases are clear.  Hepatobiliary: No focal liver lesions are identified on this noncontrast enhanced study. Gallbladder wall is not appreciably thickened. There is no biliary duct dilatation.  Pancreas: There is no pancreatic mass or inflammatory focus.  Spleen:  No splenic lesions are evident.  Adrenals/Urinary Tract: Adrenals appear normal bilaterally. There is no renal mass on either side. There is no hydronephrosis on the left. There is moderate hydronephrosis on the right. There is no intrarenal calculus on either side. There is a calculus at the right ureteropelvic junction measuring 6 x 5 mm. No other ureteral calculi are apparent on either side. Urinary bladder is midline with wall thickness within normal limits.  Stomach/Bowel: There is no appreciable bowel wall or mesenteric thickening. No bowel obstruction. No free air or portal venous air.  Vascular/Lymphatic: There are scattered foci of atherosclerotic calcification in the aorta. The major mesenteric vessels appear patent on this noncontrast enhanced study. There is no appreciable adenopathy in the abdomen or pelvis.  Reproductive: Uterus is anteverted. There is no pelvic mass or pelvic fluid collection.  Other: Appendix appears unremarkable. There is no abscess or ascites in the abdomen or pelvis.  Musculoskeletal: There is degenerative change at L5-S1 with vacuum phenomenon at this level. There is degenerative change at the pubic symphysis level as well. There is degenerative change in the left hip joint with subchondral cystic change. There are no blastic or lytic bone lesions. There is no intramuscular or abdominal wall lesion.  IMPRESSION: 6 x 5 mm calculus at the right ureteropelvic junction with moderate hydronephrosis on the right.  No bowel obstruction. No abscess. Appendix region appears normal.  Areas of aortic atherosclerosis.   Assessment & Plan:    1. Right ureteral stone 2. Probable UTI 3. Possible sepsis I discussed the patient the emergent need to decompress her right collecting system due to her obstructing ureteral stone the site of a UTI and possible sepsis. We discussed the risks, benefits, indications for cystoscopy with right  ureteral stent placement. She understands the risks include bleeding, infection, need for repeat procedures, inability to place stent among others. She says it goes to decompress her collecting system and not to remove the stone today as I could worsen her infection. All questions were answered. The patient is agreeable to proceeding.   Nickie Retort, MD  [facility] [address], [place], [place] [address] [phone]

## 2015-10-31 NOTE — ED Notes (Signed)
Urologist at bedside.

## 2015-10-31 NOTE — H&P (Signed)
[facility] [place] NAME: Rhonda Schneider    MR#:  [id number]  DATE OF BIRTH:  10-26-1960  DATE OF ADMISSION:  10/31/2015  PRIMARY CARE PHYSICIAN: No PCP Per Patient   REQUESTING/REFERRING PHYSICIAN: Dr. Loura Pardon  CHIEF COMPLAINT: Right flank pain    Chief Complaint  Patient presents with  . Emesis  . Headache  . Flank Pain    HISTORY OF PRESENT ILLNESS:  Rhonda Schneider  is a 55 y.o. female with a known history of Hyperlipidemia, recently diagnosed kidney stone on the right side she was in the emergency room on July 4 sent home with pain medication and Flomax comes back today with worsening right flank pain, persistent nausea, vomiting on and off fever. Patient tachycardic in the emergency room up to 1 20 bpm, limited white count up to 13.5, worsening renal failure with BUN 26 and creatinine 1.4. Patient shouldn't have CT abdomen for right flank pain on July 4, it showed 6 mm calculus in the right ureter pelvic junction with moderate hydronephrosis on the right side. Patient is seen by urologist today, patient is scheduled to have cystoscopy, ureteral stent placement. Patient is having Lots of pain in the right flank, tachycardia. And also has headache. Head CT is done in the emergency room which is negative.  PAST MEDICAL HISTORY:  History reviewed. No pertinent past medical history.  History of hyperlipidemia.  PAST SURGICAL HISTOIRY:  History reviewed. No pertinent past surgical history. No surgical history  SOCIAL HISTORY:   Social History  Substance Use Topics  . Smoking status: Never Smoker   . Smokeless tobacco: Not on file  . Alcohol Use: No    FAMILY HISTORY:  No family history on file. Family history of hypertension or diabetes.  DRUG ALLERGIES:  No Known Allergies  REVIEW OF SYSTEMS:  CONSTITUTIONAL: No fever, fatigue or weakness.  EYES: No blurred or double vision.  EARS, NOSE, AND THROAT: No tinnitus or  ear pain.  RESPIRATORY: No cough, shortness of breath, wheezing or hemoptysis.  CARDIOVASCULAR: No chest pain, orthopnea, edema.  GASTROINTESTINAL: nausea,, vomiting,  Unable  to keep anything down for last 2 days. Urogenital; Patient has dysuria, right CVA tenderness present.  HEMATOLOGY: No anemia, easy bruising or bleeding SKIN: No rash or lesion. MUSCULOSKELETAL: No joint pain or arthritis.   NEUROLOGIC: No tingling, numbness, weakness.  PSYCHIATRY: No anxiety or depression.   MEDICATIONS AT HOME:   Prior to Admission medications   Medication Sig Start Date End Date Taking? Authorizing Provider  ketorolac (TORADOL) 10 MG tablet Take 1 tablet (10 mg total) by mouth every 6 (six) hours as needed. 10/29/15  Yes Roderic Palau D Cuthriell, PA-C  oxyCODONE-acetaminophen (ROXICET) 5-325 MG tablet Take 1 tablet by mouth every 4 (four) hours as needed for severe pain. 10/29/15  Yes Roderic Palau D Cuthriell, PA-C  simvastatin (ZOCOR) 20 MG tablet Take 20 mg by mouth at bedtime.    Yes Historical Provider, MD  tamsulosin (FLOMAX) 0.4 MG CAPS capsule Take 1 capsule (0.4 mg total) by mouth daily. 10/29/15  Yes Jonathan D Cuthriell, PA-C      VITAL SIGNS:  Blood pressure 118/89, pulse 127, temperature 98.6 F (37 C), temperature source Oral, resp. rate 21, height 5\' 6"  (1.676 m), weight 108.41 kg (239 lb), SpO2 96 %.  PHYSICAL EXAMINATION:  GENERAL:  55 y.o.-year-old patient lying in the bed.Appears uncomfortable because of her severe flank pain  EYES: Pupils equal, round, reactive to  Schneider and accommodation. No scleral icterus. Extraocular muscles intact.  HEENT: Head atraumatic, normocephalic. Oropharynx and nasopharynx clear.  NECK:  Supple, no jugular venous distention. No thyroid enlargement, no tenderness.  LUNGS: Normal breath sounds bilaterally, no wheezing, rales,rhonchi or crepitation. No use of accessory muscles of respiration.  CARDIOVASCULAR: S1, S2 tachycardia present no murmurs or  gallops. Abdomen:  Soft, nontender, nondistended. Bowel sounds present. No organomegaly or mass.Right CVA tenderness present  EXTREMITIES: No pedal edema, cyanosis, or clubbing.  NEUROLOGIC: Cranial nerves II through XII are intact. Muscle strength 5/5 in all extremities. Sensation intact. Gait not checked.  PSYCHIATRIC: The patient is alert and oriented x 3.  SKIN: No obvious rash, lesion, or ulcer.   LABORATORY PANEL:   CBC  Recent Labs Lab 10/31/15 0830  WBC 13.5*  HGB 13.8  HCT 40.0  PLT 110*   ------------------------------------------------------------------------------------------------------------------  Chemistries   Recent Labs Lab 10/31/15 0830  NA 131*  K 3.6  CL 99*  CO2 24  GLUCOSE 154*  BUN 26*  CREATININE 1.44*  CALCIUM 8.8*  AST 41  ALT 30  ALKPHOS 86  BILITOT 1.1   ------------------------------------------------------------------------------------------------------------------  Cardiac Enzymes No results for input(s): TROPONINI in the last 168 hours. ------------------------------------------------------------------------------------------------------------------  RADIOLOGY:  Ct Abdomen Pelvis Wo Contrast  10/29/2015  CLINICAL DATA:  Right flank pain for 1 day EXAM: CT ABDOMEN AND PELVIS WITHOUT CONTRAST TECHNIQUE: Multidetector CT imaging of the abdomen and pelvis was performed following the standard protocol without oral or intravenous contrast material administration. COMPARISON:  None. FINDINGS: Lower chest:  Lung bases are clear. Hepatobiliary: No focal liver lesions are identified on this noncontrast enhanced study. Gallbladder wall is not appreciably thickened. There is no biliary duct dilatation. Pancreas: There is no pancreatic mass or inflammatory focus. Spleen: No splenic lesions are evident. Adrenals/Urinary Tract: Adrenals appear normal bilaterally. There is no renal mass on either side. There is no hydronephrosis on the left. There is  moderate hydronephrosis on the right. There is no intrarenal calculus on either side. There is a calculus at the right ureteropelvic junction measuring 6 x 5 mm. No other ureteral calculi are apparent on either side. Urinary bladder is midline with wall thickness within normal limits. Stomach/Bowel: There is no appreciable bowel wall or mesenteric thickening. No bowel obstruction. No free air or portal venous air. Vascular/Lymphatic: There are scattered foci of atherosclerotic calcification in the aorta. The major mesenteric vessels appear patent on this noncontrast enhanced study. There is no appreciable adenopathy in the abdomen or pelvis. Reproductive: Uterus is anteverted. There is no pelvic mass or pelvic fluid collection. Other: Appendix appears unremarkable. There is no abscess or ascites in the abdomen or pelvis. Musculoskeletal: There is degenerative change at L5-S1 with vacuum phenomenon at this level. There is degenerative change at the pubic symphysis level as well. There is degenerative change in the left hip joint with subchondral cystic change. There are no blastic or lytic bone lesions. There is no intramuscular or abdominal wall lesion. IMPRESSION: 6 x 5 mm calculus at the right ureteropelvic junction with moderate hydronephrosis on the right. No bowel obstruction.  No abscess.  Appendix region appears normal. Areas of aortic atherosclerosis. Electronically Signed   By: Lowella Grip III M.D.   On: 10/29/2015 19:24   Ct Head Wo Contrast  10/31/2015  CLINICAL DATA:  Nausea vomiting and severe headache for 2 days, initial encounter EXAM: CT HEAD WITHOUT CONTRAST TECHNIQUE: Contiguous axial images were obtained from the base of the skull through  the vertex without intravenous contrast. COMPARISON:  None. FINDINGS: The bony calvarium is intact. The ventricles are of normal size and configuration. No findings to suggest acute hemorrhage, acute infarction or space-occupying mass lesion are noted.  IMPRESSION: No acute intracranial abnormality noted. Electronically Signed   By: Inez Catalina M.D.   On: 10/31/2015 10:21    EKG:  No orders found for this or any previous visit.  IMPRESSION AND PLAN:   #1 .sepsis  secondary to acute pyelonephritis: Started on aggressive hydration, IV antibiotics follow urine culture data. #2. right ureteral stone with the hydronephrosis: 6 mm stone at UPJ junction: Patient is seen by urology, scheduled to have cystoscopy and  Right  ureteral stent placement today. Patient's family understands and agrees to proceed.  further  treatment as per urology. And the new pain medication, nausea medication. 3 GI, DVT prophylaxis. All the records are reviewed and case discussed with ED provider. Management plans discussed with the patient, family and they are in agreement.  CODE STATUS:  full  TOTAL TIME TAKING CARE OF THIS PATIENT: 17minutes.    Epifanio Lesches M.D on 10/31/2015 at 1:08 PM  Between 7am to 6pm - Pager - [phone]  After 6pm go to [email or web address] - password [email or web address] [facility]  [facility]  Office  [phone]  CC: Primary care physician; No PCP Per Patient  Note: This dictation was prepared with Dragon dictation along with smaller phrase technology. Any transcriptional errors that result from this process are unintentional.

## 2015-10-31 NOTE — ED Notes (Signed)
Pt assisted to the bathroom.

## 2015-10-31 NOTE — ED Notes (Addendum)
EDP at bedside, pt states headache for the past 2 days, states she was diagnoised with a kidney stone on Tuesday, pt crying and moaning, states she has been unable to eat and vomiting since Tuesday, husband at bedside

## 2015-10-31 NOTE — ED Notes (Signed)
Pt resting in bed, family at bedside, states headache is better, resting with eyes closed

## 2015-10-31 NOTE — ED Notes (Signed)
Pt has complete resolution of headache with no other symptoms

## 2015-10-31 NOTE — Op Note (Signed)
Date of procedure: 10/31/2015  Preoperative diagnosis:  1. Right ureteral stone 2. UTI 3. Sepsis   Postoperative diagnosis:  1. Right ureteral stone 2. UTI 3. Sepsis 4. Bladder tumor   Procedure: 1. Cystoscopy 2. Right retrograde pyelogram with interpretation 3. Right ureteral stent placement 6 French by 26 cm  Surgeon: Baruch Gouty, MD  Anesthesia: General  Complications: None  Intraoperative findings: The patient had an obstructive proximal right ureteral stone. An open-ended catheter was advanced over sensor wire and the hydronephrotic drip was strain the right. This urine was sent for culture. A low-pressure retropyelogram showed clotting system. The right ureter stent was firmly in place. 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  EBL: None  Specimens: Right ureter urine for culture  Drains: 6 French by 26 double-J right ureteral stent, 16 French Foley  Disposition: Stable to the postanesthesia care unit  Indication for procedure: The patient is a 55 y.o. female with a right proximal ureteral stone 6 mm in size with a UTI and signs of sepsis presents for emergent right ureteral stent placement.  After reviewing the management options for treatment, the patient elected to proceed with the above surgical procedure(s). We have discussed the potential benefits and risks of the procedure, side effects of the proposed treatment, the likelihood of the patient achieving the goals of the procedure, and any potential problems that might occur during the procedure or recuperation. Informed consent has been obtained.  Description of procedure: The patient was met in the preoperative area. All risks, benefits, and indications of the procedure were described in great detail. The patient consented to the procedure. Preoperative antibiotics were given. The patient was taken to the operative theater.  General anesthesia was induced per the anesthesia service. The patient was then placed in the dorsal lithotomy position and prepped and draped in the usual sterile fashion. A preoperative timeout was called.   A 21 French 30 cystoscope was inserted patient's bladder per urethra atraumatic. The right ureteral orifice was visualized. There is noted to be a tumor just lateral to this orifice. 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 It was not manipulated due to the patient's sepsis and UTI. Right ureteral orifice was intubated with a sensor wire. This advanced the level of the renal pelvis on fluoroscopy. An open a catheter exchange over the sensor wire a sensor wire removed. The hydronephrotic drip was drained and sent for culture. A low-pressure retrograde pyelogram was then performed to identify the clot consistent. The sensor wire was then exchanged the opening catheter and the open-ended catheter removed. A 6 French by 26 over double-J ureteral stent was in place. Sensor wire was removed. The stent was confirmed in the carpus across the patient's renal pelvis on fluoroscopy procedure the urinary bladder direct visualization. There is no distended be a large flush of fluid as a kidney drain. At this point the scope was removed. A 16 French Foley catheter was placed. The patient was woke from anesthesia and transferred to position the post anesthesia care unit.  Plan: The patient will be admitted to the floor overnight on IV antibiotics. She will continue antibiotics pending culture and sensitivity results. Her Foley will come out in the morning if she does well. She definitive management as an outpatient for her right ureteral stone. At that time, we will address the incidental finding of a 1 cm right bladder tumor.  Baruch Gouty, M.D.

## 2015-10-31 NOTE — Anesthesia Preprocedure Evaluation (Addendum)
Anesthesia Evaluation  Patient identified by MRN, date of birth, ID band Patient awake    Reviewed: Allergy & Precautions, NPO status , Patient's Chart, lab work & pertinent test results  Airway Mallampati: III       Dental  (+) Teeth Intact   Pulmonary former smoker,     + decreased breath sounds      Cardiovascular Exercise Tolerance: Good hypertension,  Rhythm:Regular     Neuro/Psych negative neurological ROS     GI/Hepatic negative GI ROS,   Endo/Other  Morbid obesity  Renal/GU      Musculoskeletal   Abdominal (+) + obese,   Peds  Hematology   Anesthesia Other Findings   Reproductive/Obstetrics                            Anesthesia Physical Anesthesia Plan  ASA: II and emergent  Anesthesia Plan: General   Post-op Pain Management:    Induction: Intravenous  Airway Management Planned: Oral ETT  Additional Equipment:   Intra-op Plan:   Post-operative Plan: Extubation in OR  Informed Consent: I have reviewed the patients History and Physical, chart, labs and discussed the procedure including the risks, benefits and alternatives for the proposed anesthesia with the patient or authorized representative who has indicated his/her understanding and acceptance.     Plan Discussed with: CRNA  Anesthesia Plan Comments:         Anesthesia Quick Evaluation

## 2015-10-31 NOTE — Anesthesia Postprocedure Evaluation (Signed)
Anesthesia Post Note  Patient: Rhonda Schneider  Procedure(s) Performed: Procedure(s) (LRB): CYSTOSCOPY WITH STENT PLACEMENT (Right)  Patient location during evaluation: PACU Anesthesia Type: General Level of consciousness: awake and alert Pain management: pain level controlled Vital Signs Assessment: post-procedure vital signs reviewed and stable Respiratory status: spontaneous breathing and respiratory function stable Cardiovascular status: stable Anesthetic complications: no    Last Vitals:  Filed Vitals:   10/31/15 1530 10/31/15 1540  BP: 113/65 110/62  Pulse: 111 106  Temp: 37.5 C   Resp: 18 15    Last Pain:  Filed Vitals:   10/31/15 1549  PainSc: 7                  KEPHART,WILLIAM K

## 2015-10-31 NOTE — ED Notes (Signed)
Pt returned from CT, resting in bed in no distress 

## 2015-10-31 NOTE — ED Provider Notes (Addendum)
[facility] Emergency Department Provider Note   ____________________________________________  Time seen: Approximately 8:26 AM  I have reviewed the triage vital signs and the nursing notes.   HISTORY  Chief Complaint Emesis; Headache; and Flank Pain    HPI Rhonda Schneider is a 55 y.o. female with no chronic medical problems who presents for evaluation of frontal headache, recurrent nonbloody nonbilious emesis, recurrent diarrhea as well as continued right flank pain over the past 2-3 days, gradual onset, constant, headache typically improves with Toradol and Percocet but then recurs. The patient was seen in this emergency department on 10/29/2015 for right flank pain, diagnosed with a 6 x 5 mm right UPJ stone by CT scan. She was discharged with narcotics as well as Toradol. She reports continued right flank pain and since she left the ER, has developed multiple episodes of nonbloody nonbilious emesis. She reports that she vomits even when she tries to drink water. She is also having a frontal headache, no vision change, no numbness or weakness, no neck pain or neck stiffness, no fevers. No abdominal pain. This morning she has had at least 4 episodes of nonbloody diarrhea. No chest pain or difficulty breathing. No known sick contacts.   Past Medical History  Diagnosis Date  . Hypertension     no medications    Patient Active Problem List   Diagnosis Date Noted  . Acute pyelonephritis 10/31/2015    Past Surgical History  Procedure Laterality Date  . Tubal ligation    . Appendectomy  1978    ruptured  . Cesarean section  1987    No current outpatient prescriptions on file.  Allergies Review of patient's allergies indicates no known allergies.  History reviewed. No pertinent family history.  Social History Social History  Substance Use Topics  . Smoking status: Never Smoker   . Smokeless tobacco: None  . Alcohol Use: No    Review of  Systems Constitutional: No fever/chills Eyes: No visual changes. ENT: No sore throat. Cardiovascular: Denies chest pain. Respiratory: Denies shortness of breath. Gastrointestinal: No abdominal pain.  + nausea, + vomiting.  + diarrhea.  No constipation. Genitourinary: Negative for dysuria. Musculoskeletal: Positive for right flank pain. Skin: Negative for rash. Neurological: Positive for headaches, no focal weakness or numbness.  10-point ROS otherwise negative.  ____________________________________________   PHYSICAL EXAM:  Filed Vitals:   10/31/15 1530 10/31/15 1540 10/31/15 1552 10/31/15 1555  BP: 113/65 110/62    Pulse: 111 106 103 107  Temp: 99.5 F (37.5 C)     TempSrc:      Resp: 18 15 18 18   Height:      Weight:      SpO2: 100% 100% 100% 90%    VITAL SIGNS: ED Triage Vitals  Enc Vitals Group     BP --      Pulse --      Resp --      Temp --      Temp src --      SpO2 --      Weight 10/31/15 0821 239 lb (108.41 kg)     Height 10/31/15 0821 5\' 6"  (1.676 m)     Head Cir --      Peak Flow --      Pain Score 10/31/15 0821 10     Pain Loc --      Pain Edu? --      Excl. in [place]? --     Constitutional: Alert  and oriented. In moderate distress secondary to pain, intermittent and grimacing and crying out "my head..my back" and trying to position herself in bed to find a position of comfort. Eyes: Conjunctivae are normal. PERRL. EOMI. Head: Atraumatic. Nose: No congestion/rhinnorhea. Mouth/Throat: Mucous membranes are moist.  Oropharynx non-erythematous. Neck: No stridor.  Supple without meningismus. Cardiovascular: Normal rate, regular rhythm. Grossly normal heart sounds.  Good peripheral circulation. Respiratory: Normal respiratory effort.  No retractions. Lungs CTAB. Gastrointestinal: Soft and nontender. No distention.  No CVA tenderness. Genitourinary: Deferred Musculoskeletal: No lower extremity tenderness nor edema.  No joint effusions. Neurologic:   Normal speech and language. No gross focal neurologic deficits are appreciated. No gait instability. Skin:  Skin is warm, dry and intact. No rash noted. Psychiatric: Mood and affect are normal. Speech and behavior are normal.  ____________________________________________   LABS (all labs ordered are listed, but only abnormal results are displayed)  Labs Reviewed  CBC WITH DIFFERENTIAL/PLATELET - Abnormal; Notable for the following:    WBC 13.5 (*)    Platelets 110 (*)    Neutro Abs 12.8 (*)    Lymphs Abs 0.3 (*)    All other components within normal limits  COMPREHENSIVE METABOLIC PANEL - Abnormal; Notable for the following:    Sodium 131 (*)    Chloride 99 (*)    Glucose, Bld 154 (*)    BUN 26 (*)    Creatinine, Ser 1.44 (*)    Calcium 8.8 (*)    GFR calc non Af Amer 40 (*)    GFR calc Af Amer 47 (*)    All other components within normal limits  URINALYSIS COMPLETEWITH MICROSCOPIC ([facility] ONLY) - Abnormal; Notable for the following:    Color, Urine YELLOW (*)    APPearance HAZY (*)    Hgb urine dipstick 2+ (*)    Protein, ur 30 (*)    Leukocytes, UA 3+ (*)    Squamous Epithelial / LPF 0-5 (*)    All other components within normal limits  URINE CULTURE  CULTURE, BLOOD (ROUTINE X 2)  CULTURE, BLOOD (ROUTINE X 2)  URINE CULTURE  URINE CULTURE  LIPASE, BLOOD  LACTIC ACID, PLASMA  PREGNANCY, URINE  PREGNANCY, URINE  LACTIC ACID, PLASMA   ____________________________________________  EKG  none ____________________________________________  RADIOLOGY  none ____________________________________________   PROCEDURES  Procedure(s) performed: None  Procedures  Critical Care performed:   CRITICAL CARE Performed by: Loura Pardon A   Total critical care time: 35 minutes  Critical care time was exclusive of separately billable procedures and treating other patients.  Critical care was necessary to treat or prevent imminent or life-threatening  deterioration.  Critical care was time spent personally by me on the following activities: development of treatment plan with patient and/or surrogate as well as nursing, discussions with consultants, evaluation of patient's response to treatment, examination of patient, obtaining history from patient or surrogate, ordering and performing treatments and interventions, ordering and review of laboratory studies, ordering and review of radiographic studies, pulse oximetry and re-evaluation of patient's condition.  ____________________________________________   INITIAL IMPRESSION / ASSESSMENT AND PLAN / ED COURSE  Pertinent labs & imaging results that were available during my care of the patient were reviewed by me and considered in my medical decision making (see chart for details).  DEEANN KATONA is a 55 y.o. female with no chronic medical problems who presents for evaluation of frontal headache, recurrent nonbloody nonbilious emesis, recurrent diarrhea as well as continued right flank pain over  the past 2-3 days. On exam, she is in distress complaining of headache and right flank pain. She has a benign abdominal exam as well as an intact neurological examination. Her vital signs are notable for tachypnea which I suspect is pain related, she is mildly hypotensive. She is afebrile. We'll obtain screening labs, treat her symptomatically with a migraine cocktail which will also help with her flank/kidney stone pain and vomiting, reassess for disposition.   ----------------------------------------- 1:26 PM on 10/31/2015 ----------------------------------------- Patient reports symptomatic improvement at this time, she is awake, alert, oriented, she reports complete resolution of her headache. Labs reviewed and are notable for leukocytosis, white blood cell count is elevated at 13.5. CMP with mild creatinine elevation 1.44, normal lipase and urinalysis is concerning for infection. CT head negative. I  discussed the case with Dr. Pilar Jarvis of urology and as the patient is now tachycardic with an elevated white count and a concerning urinalysis, my concern is for an infected obstructing stone. He recommends admission to the hospitalist and we'll place a stent today. Case discussed with hospice for admission at this time. We'll continue sepsis treatment with liberal IV fluids. She did receive ceftriaxone. Lactic acid is reassuring, no hypotension, mentating appropriately, will not give full 30 mL/kg bolus of normal saline due to risk of precipitating flash pulmonary edema, no evidence of severe sepsis at this time.      ____________________________________________   FINAL CLINICAL IMPRESSION(S) / ED DIAGNOSES  Final diagnoses:  Flank pain, acute  Acute nonintractable headache, unspecified headache type  Nausea vomiting and diarrhea  Ureterolithiasis  Sepsis, due to unspecified organism [facility])      NEW MEDICATIONS STARTED DURING THIS VISIT:  Current Discharge Medication List       Note:  This document was prepared using Dragon voice recognition software and may include unintentional dictation errors.    Joanne Gavel, MD 10/31/15 [place] Tayelor Osborne, MD 10/31/15 [phone]

## 2015-11-01 ENCOUNTER — Encounter: Payer: Self-pay | Admitting: Urology

## 2015-11-01 ENCOUNTER — Telehealth: Payer: Self-pay

## 2015-11-01 LAB — BLOOD CULTURE ID PANEL (REFLEXED)
Acinetobacter baumannii: NOT DETECTED
CANDIDA GLABRATA: NOT DETECTED
CANDIDA PARAPSILOSIS: NOT DETECTED
CANDIDA TROPICALIS: NOT DETECTED
Candida albicans: NOT DETECTED
Candida krusei: NOT DETECTED
Carbapenem resistance: NOT DETECTED
ENTEROCOCCUS SPECIES: NOT DETECTED
Enterobacter cloacae complex: NOT DETECTED
Enterobacteriaceae species: DETECTED — AB
Escherichia coli: NOT DETECTED
HAEMOPHILUS INFLUENZAE: NOT DETECTED
Klebsiella oxytoca: NOT DETECTED
Klebsiella pneumoniae: NOT DETECTED
LISTERIA MONOCYTOGENES: NOT DETECTED
METHICILLIN RESISTANCE: NOT DETECTED
Neisseria meningitidis: NOT DETECTED
PROTEUS SPECIES: DETECTED — AB
Pseudomonas aeruginosa: NOT DETECTED
SERRATIA MARCESCENS: NOT DETECTED
STAPHYLOCOCCUS AUREUS BCID: NOT DETECTED
STREPTOCOCCUS PYOGENES: NOT DETECTED
Staphylococcus species: NOT DETECTED
Streptococcus agalactiae: NOT DETECTED
Streptococcus pneumoniae: NOT DETECTED
Streptococcus species: NOT DETECTED
VANCOMYCIN RESISTANCE: NOT DETECTED

## 2015-11-01 LAB — BASIC METABOLIC PANEL
Anion gap: 4 — ABNORMAL LOW (ref 5–15)
BUN: 18 mg/dL (ref 6–20)
CALCIUM: 8.5 mg/dL — AB (ref 8.9–10.3)
CHLORIDE: 112 mmol/L — AB (ref 101–111)
CO2: 24 mmol/L (ref 22–32)
CREATININE: 1 mg/dL (ref 0.44–1.00)
GFR calc non Af Amer: >60 mL/min (ref >60)
Glucose, Bld: 164 mg/dL — ABNORMAL HIGH (ref 65–99)
Potassium: 4.7 mmol/L (ref 3.5–5.1)
SODIUM: 140 mmol/L (ref 135–145)

## 2015-11-01 LAB — CBC
HCT: 36.5 % (ref 35.0–47.0)
Hemoglobin: 12.7 g/dL (ref 12.0–16.0)
MCH: 30.5 pg (ref 26.0–34.0)
MCHC: 34.7 g/dL (ref 32.0–36.0)
MCV: 87.7 fL (ref 80.0–100.0)
PLATELETS: 107 10*3/uL — AB (ref 150–440)
RBC: 4.17 MIL/uL (ref 3.80–5.20)
RDW: 13 % (ref 11.5–14.5)
WBC: 11.9 10*3/uL — ABNORMAL HIGH (ref 3.6–11.0)

## 2015-11-01 LAB — URINE CULTURE

## 2015-11-01 LAB — GLUCOSE, CAPILLARY: GLUCOSE-CAPILLARY: 141 mg/dL — AB (ref 65–99)

## 2015-11-01 MED ORDER — DEXTROSE 5 % IV SOLN
2.0000 g | Freq: Every day | INTRAVENOUS | Status: DC
  Administered 2015-11-01 – 2015-11-03 (×3): 2 g via INTRAVENOUS
  Filled 2015-11-01 (×3): qty 2

## 2015-11-01 NOTE — Telephone Encounter (Signed)
-----   Message from Nickie Retort, MD sent at 11/01/2015  9:33 AM EDT ----- Patient needs to be seen in 1-2 weeks to discuss ureteroscopy and turbt. Thanks.

## 2015-11-01 NOTE — Progress Notes (Signed)
Spoke with Dr. Pilar Jarvis per MD okay to remove foley catheter

## 2015-11-01 NOTE — Progress Notes (Signed)
[place] at [facility]                                                                                                                                                                                            Patient Demographics   Rhonda Schneider, is a 55 y.o. female, DOB - [birth year], [id number]  Admit date - 10/31/2015   Admitting Physician Nickie Retort, MD  Outpatient Primary MD for the patient is No PCP Per Patient   LOS - 1  Subjective: Patient feeling better pain and nausea is resolved Underwent a cystoscopy yesterday with a right ureteral stent placement    Review of Systems:   CONSTITUTIONAL: No documented fever. No fatigue, weakness. No weight gain, no weight loss.  EYES: No blurry or double vision.  ENT: No tinnitus. No postnasal drip. No redness of the oropharynx.  RESPIRATORY: No cough, no wheeze, no hemoptysis. No dyspnea.  CARDIOVASCULAR: No chest pain. No orthopnea. No palpitations. No syncope.  GASTROINTESTINAL: No nausea, no vomiting or diarrhea. No abdominal pain. No melena or hematochezia.  GENITOURINARY: No dysuria or hematuria.  ENDOCRINE: No polyuria or nocturia. No heat or cold intolerance.  HEMATOLOGY: No anemia. No bruising. No bleeding.  INTEGUMENTARY: No rashes. No lesions.  MUSCULOSKELETAL: No arthritis. No swelling. No gout.  NEUROLOGIC: No numbness, tingling, or ataxia. No seizure-type activity.  PSYCHIATRIC: No anxiety. No insomnia. No ADD.    Vitals:   Filed Vitals:   11/01/15 0500 11/01/15 0511 11/01/15 0512 11/01/15 1159  BP:  136/68  130/62  Pulse:  74 71 73  Temp:  98.1 F (36.7 C)  98.1 F (36.7 C)  TempSrc:  Oral  Oral  Resp:  14  18  Height:      Weight: 109.725 kg (241 lb 14.4 oz)     SpO2:  93% 95% 95%    Wt Readings from Last 3 Encounters:  11/01/15 109.725 kg (241 lb 14.4 oz)  10/29/15 108.41 kg (239 lb)     Intake/Output Summary (Last 24 hours) at 11/01/15 1221 Last  data filed at 11/01/15 0949  Gross per 24 hour  Intake 4261.53 ml  Output   3710 ml  Net 551.53 ml    Physical Exam:   GENERAL: Pleasant-appearing in no apparent distress.  HEAD, EYES, EARS, NOSE AND THROAT: Atraumatic, normocephalic. Extraocular muscles are intact. Pupils equal and reactive to light. Sclerae anicteric. No conjunctival injection. No oro-pharyngeal erythema.  NECK: Supple. There is no jugular venous distention. No bruits, no lymphadenopathy, no thyromegaly.  HEART: Regular rate and rhythm,. No murmurs, no rubs, no clicks.  LUNGS: Clear to auscultation bilaterally. No rales or rhonchi. No wheezes.  ABDOMEN: Soft, flat, nontender, nondistended. Has good bowel sounds. No hepatosplenomegaly appreciated.  EXTREMITIES: No evidence of any cyanosis, clubbing, or peripheral edema.  +2 pedal and radial pulses bilaterally.  NEUROLOGIC: The patient is alert, awake, and oriented x3 with no focal motor or sensory deficits appreciated bilaterally.  SKIN: Moist and warm with no rashes appreciated.  Psych: Not anxious, depressed LN: No inguinal LN enlargement    Antibiotics   Anti-infectives    Start     Dose/Rate Route Frequency Ordered Stop   11/01/15 1000  cefTRIAXone (ROCEPHIN) 1 g in dextrose 5 % 50 mL IVPB  Status:  Discontinued     1 g 100 mL/hr over 30 Minutes Intravenous Every 24 hours 10/31/15 1700 11/01/15 0542   11/01/15 0600  cefTRIAXone (ROCEPHIN) 2 g in dextrose 5 % 50 mL IVPB     2 g 100 mL/hr over 30 Minutes Intravenous Daily 11/01/15 0542     10/31/15 1200  ciprofloxacin (CIPRO) tablet 500 mg  Status:  Discontinued     500 mg Oral  Once 10/31/15 1153 10/31/15 1154   10/31/15 1200  cefTRIAXone (ROCEPHIN) 1 g in dextrose 5 % 50 mL IVPB     1 g 100 mL/hr over 30 Minutes Intravenous  Once 10/31/15 1154 10/31/15 1250      Medications   Scheduled Meds: . cefTRIAXone (ROCEPHIN)  IV  2 g Intravenous Daily  . famotidine (PEPCID) IV  20 mg Intravenous Q12H  .  heparin  5,000 Units Subcutaneous Q8H  . simvastatin  20 mg Oral QHS  . tamsulosin  0.4 mg Oral Daily   Continuous Infusions: . sodium chloride 150 mL/hr at 11/01/15 0615   PRN Meds:.acetaminophen **OR** acetaminophen, morphine injection, ondansetron **OR** ondansetron (ZOFRAN) IV   Data Review:   Micro Results Recent Results (from the past 240 hour(s))  Blood culture (routine x 2)     Status: None (Preliminary result)   Collection Time: 10/31/15 12:12 PM  Result Value Ref Range Status   Specimen Description BLOOD LEFT ASSIST CONTROL  Final   Special Requests BOTTLES DRAWN AEROBIC AND ANAEROBIC [place]  Final   Culture  Setup Time   Final    GRAM NEGATIVE RODS ANAEROBIC BOTTLE ONLY CRITICAL RESULT CALLED TO, READ BACK BY AND VERIFIED WITH: NATE COOKSON 11/01/15 0539 TLB    Culture GRAM NEGATIVE RODS  Final   Report Status PENDING  Incomplete  Blood Culture ID Panel (Reflexed)     Status: Abnormal   Collection Time: 10/31/15 12:12 PM  Result Value Ref Range Status   Enterococcus species NOT DETECTED NOT DETECTED Final   Vancomycin resistance NOT DETECTED NOT DETECTED Final   Listeria monocytogenes NOT DETECTED NOT DETECTED Final   Staphylococcus species NOT DETECTED NOT DETECTED Final   Staphylococcus aureus NOT DETECTED NOT DETECTED Final   Methicillin resistance NOT DETECTED NOT DETECTED Final   Streptococcus species NOT DETECTED NOT DETECTED Final   Streptococcus agalactiae NOT DETECTED NOT DETECTED Final   Streptococcus pneumoniae NOT DETECTED NOT DETECTED Final   Streptococcus pyogenes NOT DETECTED NOT DETECTED Final   Acinetobacter baumannii NOT DETECTED NOT DETECTED Final   Enterobacteriaceae species DETECTED (A) NOT DETECTED Final    Comment: CRITICAL RESULT CALLED TO, READ BACK BY AND VERIFIED WITH: NATE COOKSON ON 11/01/15 AT 0539 BY TLB    Enterobacter cloacae complex NOT DETECTED NOT DETECTED Final   Escherichia coli NOT DETECTED NOT DETECTED  Final   Klebsiella  oxytoca NOT DETECTED NOT DETECTED Final   Klebsiella pneumoniae NOT DETECTED NOT DETECTED Final   Proteus species DETECTED (A) NOT DETECTED Final    Comment: CRITICAL RESULT CALLED TO, READ BACK BY AND VERIFIED WITH: NATE COOKSON ON 11/01/15 AT 0539 BY TLB    Serratia marcescens NOT DETECTED NOT DETECTED Final   Carbapenem resistance NOT DETECTED NOT DETECTED Final   Haemophilus influenzae NOT DETECTED NOT DETECTED Final   Neisseria meningitidis NOT DETECTED NOT DETECTED Final   Pseudomonas aeruginosa NOT DETECTED NOT DETECTED Final   Candida albicans NOT DETECTED NOT DETECTED Final   Candida glabrata NOT DETECTED NOT DETECTED Final   Candida krusei NOT DETECTED NOT DETECTED Final   Candida parapsilosis NOT DETECTED NOT DETECTED Final   Candida tropicalis NOT DETECTED NOT DETECTED Final  Blood culture (routine x 2)     Status: None (Preliminary result)   Collection Time: 10/31/15 12:13 PM  Result Value Ref Range Status   Specimen Description BLOOD RIGHT ASSIST CONTROL  Final   Special Requests BOTTLES DRAWN AEROBIC AND ANAEROBIC [place]  Final   Culture  Setup Time GRAM NEGATIVE RODS AEROBIC BOTTLE ONLY   Final   Culture GRAM NEGATIVE RODS  Final   Report Status PENDING  Incomplete    Radiology Reports Ct Abdomen Pelvis Wo Contrast  10/29/2015  CLINICAL DATA:  Right flank pain for 1 day EXAM: CT ABDOMEN AND PELVIS WITHOUT CONTRAST TECHNIQUE: Multidetector CT imaging of the abdomen and pelvis was performed following the standard protocol without oral or intravenous contrast material administration. COMPARISON:  None. FINDINGS: Lower chest:  Lung bases are clear. Hepatobiliary: No focal liver lesions are identified on this noncontrast enhanced study. Gallbladder wall is not appreciably thickened. There is no biliary duct dilatation. Pancreas: There is no pancreatic mass or inflammatory focus. Spleen: No splenic lesions are evident. Adrenals/Urinary Tract: Adrenals appear normal bilaterally.  There is no renal mass on either side. There is no hydronephrosis on the left. There is moderate hydronephrosis on the right. There is no intrarenal calculus on either side. There is a calculus at the right ureteropelvic junction measuring 6 x 5 mm. No other ureteral calculi are apparent on either side. Urinary bladder is midline with wall thickness within normal limits. Stomach/Bowel: There is no appreciable bowel wall or mesenteric thickening. No bowel obstruction. No free air or portal venous air. Vascular/Lymphatic: There are scattered foci of atherosclerotic calcification in the aorta. The major mesenteric vessels appear patent on this noncontrast enhanced study. There is no appreciable adenopathy in the abdomen or pelvis. Reproductive: Uterus is anteverted. There is no pelvic mass or pelvic fluid collection. Other: Appendix appears unremarkable. There is no abscess or ascites in the abdomen or pelvis. Musculoskeletal: There is degenerative change at L5-S1 with vacuum phenomenon at this level. There is degenerative change at the pubic symphysis level as well. There is degenerative change in the left hip joint with subchondral cystic change. There are no blastic or lytic bone lesions. There is no intramuscular or abdominal wall lesion. IMPRESSION: 6 x 5 mm calculus at the right ureteropelvic junction with moderate hydronephrosis on the right. No bowel obstruction.  No abscess.  Appendix region appears normal. Areas of aortic atherosclerosis. Electronically Signed   By: Lowella Grip III M.D.   On: 10/29/2015 19:24   Ct Head Wo Contrast  10/31/2015  CLINICAL DATA:  Nausea vomiting and severe headache for 2 days, initial encounter EXAM: CT HEAD WITHOUT CONTRAST TECHNIQUE:  Contiguous axial images were obtained from the base of the skull through the vertex without intravenous contrast. COMPARISON:  None. FINDINGS: The bony calvarium is intact. The ventricles are of normal size and configuration. No findings to  suggest acute hemorrhage, acute infarction or space-occupying mass lesion are noted. IMPRESSION: No acute intracranial abnormality noted. Electronically Signed   By: Inez Catalina M.D.   On: 10/31/2015 10:21     CBC  Recent Labs Lab 10/31/15 0830 11/01/15 0439  WBC 13.5* 11.9*  HGB 13.8 12.7  HCT 40.0 36.5  PLT 110* 107*  MCV 88.1 87.7  MCH 30.4 30.5  MCHC 34.5 34.7  RDW 12.8 13.0  LYMPHSABS 0.3*  --   MONOABS 0.4  --   EOSABS 0.0  --   BASOSABS 0.0  --     Chemistries   Recent Labs Lab 10/31/15 0830 11/01/15 0439  NA 131* 140  K 3.6 4.7  CL 99* 112*  CO2 24 24  GLUCOSE 154* 164*  BUN 26* 18  CREATININE 1.44* 1.00  CALCIUM 8.8* 8.5*  AST 41  --   ALT 30  --   ALKPHOS 86  --   BILITOT 1.1  --    ------------------------------------------------------------------------------------------------------------------ estimated creatinine clearance is 80.7 mL/min (by C-G formula based on Cr of 1). ------------------------------------------------------------------------------------------------------------------ No results for input(s): HGBA1C in the last 72 hours. ------------------------------------------------------------------------------------------------------------------ No results for input(s): CHOL, HDL, LDLCALC, TRIG, CHOLHDL, LDLDIRECT in the last 72 hours. ------------------------------------------------------------------------------------------------------------------ No results for input(s): TSH, T4TOTAL, T3FREE, THYROIDAB in the last 72 hours.  Invalid input(s): FREET3 ------------------------------------------------------------------------------------------------------------------ No results for input(s): VITAMINB12, FOLATE, FERRITIN, TIBC, IRON, RETICCTPCT in the last 72 hours.  Coagulation profile No results for input(s): INR, PROTIME in the last 168 hours.  No results for input(s): DDIMER in the last 72 hours.  Cardiac Enzymes No results for input(s):  CKMB, TROPONINI, MYOGLOBIN in the last 168 hours.  Invalid input(s): CK ------------------------------------------------------------------------------------------------------------------ Invalid input(s): [place]  Patient is a 55 year old admitted with pyelonephritis with right ureteral stone   #1 .sepsis secondary to acute pyelonephritis:  Await urine cultures continue IV antibiotics Due to significant fevers intraoperatively urology recommended 1 more night of stay in the hospital  #2. right ureteral stone with the hydronephrosis: Status post stent placement  Foley removal today #3.  GI, DVT prophylaxis.     Code Status Orders        Start     Ordered   10/31/15 1306  Full code   Continuous     10/31/15 1307    Code Status History    Date Active Date Inactive Code Status Order ID Comments User Context   This patient has a current code status but no historical code status.           Consults  80min  DVT Prophylaxis heprin  Lab Results  Component Value Date   PLT 107* 11/01/2015     Time Spent in minutes 82min Greater than 50% of time spent in care coordination and counseling patient regarding the condition and plan of care.   Dustin Flock M.D on 11/01/2015 at 12:21 PM  Between 7am to 6pm - Pager - [phone]  After 6pm go to [email or web address] - password [email or web address] [facility]   Office  [phone]

## 2015-11-01 NOTE — Progress Notes (Signed)
Pharmacy Antibiotic Follow-up Note  Rhonda Schneider is a 55 y.o. year-old female admitted on 10/31/2015.  The patient is currently on day 2 of ceftriaxone.  Assessment/Plan: Lab reports BCID GNR in anaerobic bottle of 1 set, enterobacteriaceae, proteus, KPC not detected. Adjusted dose to 2 gm IV Q24H.  Temp (24hrs), Avg:98.9 F (37.2 C), Min:97.9 F (36.6 C), Max:102.8 F (39.3 C)   Recent Labs Lab 10/31/15 0830 11/01/15 0439  WBC 13.5* 11.9*    Recent Labs Lab 10/31/15 0830  CREATININE 1.44*   Estimated Creatinine Clearance: 56.1 mL/min (by C-G formula based on Cr of 1.44).    No Known Allergies   Thank you for allowing pharmacy to be a part of this patient's care.  Laural Benes, Pharm.D., BCPS Clinical Pharmacist 11/01/2015 5:43 AM

## 2015-11-01 NOTE — Progress Notes (Signed)
Febrile post op. Currently AF No other events overnight No n/v/f/c Feels better Pain controlled Tolerating diet  Filed Vitals:   10/31/15 2226 11/01/15 0500 11/01/15 0511 11/01/15 0512  BP:   136/68   Pulse: 83  74 71  Temp:   98.1 F (36.7 C)   TempSrc:   Oral   Resp:   14   Height:      Weight:  241 lb 14.4 oz (109.725 kg)    SpO2: 97%  93% 95%   I/O last 3 completed shifts: In: 3785.5 [P.O.:180; I.V.:3605.5] Out: [id number] [Urine:3325; Blood:10] Total I/O In: 236 [I.V.:236] Out: 0   NAD Soft NT ND Foley clear yellow  CBC    Component Value Date/Time   WBC 11.9* 11/01/2015 0439   RBC 4.17 11/01/2015 0439   HGB 12.7 11/01/2015 0439   HCT 36.5 11/01/2015 0439   PLT 107* 11/01/2015 0439   MCV 87.7 11/01/2015 0439   MCH 30.5 11/01/2015 0439   MCHC 34.7 11/01/2015 0439   RDW 13.0 11/01/2015 0439   LYMPHSABS 0.3* 10/31/2015 0830   MONOABS 0.4 10/31/2015 0830   EOSABS 0.0 10/31/2015 0830   BASOSABS 0.0 10/31/2015 0830    BMP Latest Ref Rng 11/01/2015 10/31/2015  Glucose 65 - 99 mg/dL 164(H) 154(H)  BUN 6 - 20 mg/dL 18 26(H)  Creatinine 0.44 - 1.00 mg/dL 1.00 1.44(H)  Sodium 135 - 145 mmol/L 140 131(L)  Potassium 3.5 - 5.1 mmol/L 4.7 3.6  Chloride 101 - 111 mmol/L 112(H) 99(L)  CO2 22 - 32 mmol/L 24 24  Calcium 8.9 - 10.3 mg/dL 8.5(L) 8.8(L)    POD 1 cysto, right stent for septic stone w/ incidental finding of small bladder tumor -d/c foley -continue abx pending c/s -no further urologic intervention during this admission. Will need outpt management of stone/bladder tumor once infection resolves

## 2015-11-02 LAB — URINE CULTURE

## 2015-11-02 LAB — GLUCOSE, CAPILLARY: Glucose-Capillary: 107 mg/dL — ABNORMAL HIGH (ref 65–99)

## 2015-11-02 MED ORDER — FAMOTIDINE 20 MG PO TABS
20.0000 mg | ORAL_TABLET | Freq: Two times a day (BID) | ORAL | Status: DC
  Administered 2015-11-02 – 2015-11-03 (×3): 20 mg via ORAL
  Filled 2015-11-02 (×3): qty 1

## 2015-11-02 NOTE — Plan of Care (Signed)
Problem: Safety: Goal: Ability to remain free from injury will improve Outcome: Progressing Wearing non skid socks

## 2015-11-02 NOTE — Progress Notes (Signed)
[place] at [facility]                                                                                                                                                                                            Patient Demographics   Rhonda Schneider, is a 55 y.o. female, DOB - 07/28/1960, [id number]  Admit date - 10/31/2015   Admitting Physician Nickie Retort, MD  Outpatient Primary MD for the patient is No PCP Per Patient   LOS - 2  Subjective: Patient feeling better pain and nausea is resolved Underwent a cystoscopy yesterday with a right ureteral stent placement Her blood culture is positive for Proteus which is same as in the urine culture. No fever.  Review of Systems:   CONSTITUTIONAL: No documented fever. No fatigue, weakness. No weight gain, no weight loss.  EYES: No blurry or double vision.  ENT: No tinnitus. No postnasal drip. No redness of the oropharynx.  RESPIRATORY: No cough, no wheeze, no hemoptysis. No dyspnea.  CARDIOVASCULAR: No chest pain. No orthopnea. No palpitations. No syncope.  GASTROINTESTINAL: No nausea, no vomiting or diarrhea. No abdominal pain. No melena or hematochezia.  GENITOURINARY: No dysuria or hematuria.  ENDOCRINE: No polyuria or nocturia. No heat or cold intolerance.  HEMATOLOGY: No anemia. No bruising. No bleeding.  INTEGUMENTARY: No rashes. No lesions.  MUSCULOSKELETAL: No arthritis. No swelling. No gout.  NEUROLOGIC: No numbness, tingling, or ataxia. No seizure-type activity.  PSYCHIATRIC: No anxiety. No insomnia. No ADD.    Vitals:   Filed Vitals:   11/01/15 2015 11/02/15 0407 11/02/15 0500 11/02/15 1300  BP: 138/72 141/77  134/71  Pulse: 77 84  72  Temp: 98 F (36.7 C) 98.1 F (36.7 C)  98 F (36.7 C)  TempSrc: Oral Oral  Oral  Resp: 20 20  16   Height:      Weight:   106.505 kg (234 lb 12.8 oz)   SpO2: 97% 95%  98%    Wt Readings from Last 3 Encounters:  11/02/15 106.505 kg (234  lb 12.8 oz)  10/29/15 108.41 kg (239 lb)     Intake/Output Summary (Last 24 hours) at 11/02/15 1404 Last data filed at 11/02/15 1038  Gross per 24 hour  Intake 4122.5 ml  Output      0 ml  Net 4122.5 ml    Physical Exam:   GENERAL: Pleasant-appearing in no apparent distress.  HEAD, EYES, EARS, NOSE AND THROAT: Atraumatic, normocephalic. Extraocular muscles are intact. Pupils equal and reactive to light. Sclerae anicteric. No conjunctival injection. No oro-pharyngeal erythema.  NECK: Supple. There is no jugular  venous distention. No bruits, no lymphadenopathy, no thyromegaly.  HEART: Regular rate and rhythm,. No murmurs, no rubs, no clicks.  LUNGS: Clear to auscultation bilaterally. No rales or rhonchi. No wheezes.  ABDOMEN: Soft, flat, nontender, nondistended. Has good bowel sounds. No hepatosplenomegaly appreciated.  EXTREMITIES: No evidence of any cyanosis, clubbing, or peripheral edema.  +2 pedal and radial pulses bilaterally.  NEUROLOGIC: The patient is alert, awake, and oriented x3 with no focal motor or sensory deficits appreciated bilaterally.  SKIN: Moist and warm with no rashes appreciated.  Psych: Not anxious, depressed LN: No inguinal LN enlargement    Antibiotics   Anti-infectives    Start     Dose/Rate Route Frequency Ordered Stop   11/01/15 1000  cefTRIAXone (ROCEPHIN) 1 g in dextrose 5 % 50 mL IVPB  Status:  Discontinued     1 g 100 mL/hr over 30 Minutes Intravenous Every 24 hours 10/31/15 1700 11/01/15 0542   11/01/15 0600  cefTRIAXone (ROCEPHIN) 2 g in dextrose 5 % 50 mL IVPB     2 g 100 mL/hr over 30 Minutes Intravenous Daily 11/01/15 0542     10/31/15 1200  ciprofloxacin (CIPRO) tablet 500 mg  Status:  Discontinued     500 mg Oral  Once 10/31/15 1153 10/31/15 1154   10/31/15 1200  cefTRIAXone (ROCEPHIN) 1 g in dextrose 5 % 50 mL IVPB     1 g 100 mL/hr over 30 Minutes Intravenous  Once 10/31/15 1154 10/31/15 1250      Medications   Scheduled Meds: .  cefTRIAXone (ROCEPHIN)  IV  2 g Intravenous Daily  . famotidine  20 mg Oral BID  . heparin  5,000 Units Subcutaneous Q8H  . simvastatin  20 mg Oral QHS  . tamsulosin  0.4 mg Oral Daily   Continuous Infusions:   PRN Meds:.acetaminophen **OR** acetaminophen, morphine injection, ondansetron **OR** ondansetron (ZOFRAN) IV   Data Review:   Micro Results Recent Results (from the past 240 hour(s))  Blood culture (routine x 2)     Status: Abnormal (Preliminary result)   Collection Time: 10/31/15 12:12 PM  Result Value Ref Range Status   Specimen Description BLOOD LEFT ASSIST CONTROL  Final   Special Requests BOTTLES DRAWN AEROBIC AND ANAEROBIC [place]  Final   Culture  Setup Time   Final    GRAM NEGATIVE RODS ANAEROBIC BOTTLE ONLY CRITICAL RESULT CALLED TO, READ BACK BY AND VERIFIED WITH: NATE COOKSON 11/01/15 0539 TLB    Culture PROTEUS SPECIES (A)  Final   Report Status PENDING  Incomplete  Blood Culture ID Panel (Reflexed)     Status: Abnormal   Collection Time: 10/31/15 12:12 PM  Result Value Ref Range Status   Enterococcus species NOT DETECTED NOT DETECTED Final   Vancomycin resistance NOT DETECTED NOT DETECTED Final   Listeria monocytogenes NOT DETECTED NOT DETECTED Final   Staphylococcus species NOT DETECTED NOT DETECTED Final   Staphylococcus aureus NOT DETECTED NOT DETECTED Final   Methicillin resistance NOT DETECTED NOT DETECTED Final   Streptococcus species NOT DETECTED NOT DETECTED Final   Streptococcus agalactiae NOT DETECTED NOT DETECTED Final   Streptococcus pneumoniae NOT DETECTED NOT DETECTED Final   Streptococcus pyogenes NOT DETECTED NOT DETECTED Final   Acinetobacter baumannii NOT DETECTED NOT DETECTED Final   Enterobacteriaceae species DETECTED (A) NOT DETECTED Final    Comment: CRITICAL RESULT CALLED TO, READ BACK BY AND VERIFIED WITH: NATE COOKSON ON 11/01/15 AT 0539 BY TLB    Enterobacter cloacae complex NOT DETECTED  NOT DETECTED Final   Escherichia coli NOT  DETECTED NOT DETECTED Final   Klebsiella oxytoca NOT DETECTED NOT DETECTED Final   Klebsiella pneumoniae NOT DETECTED NOT DETECTED Final   Proteus species DETECTED (A) NOT DETECTED Final    Comment: CRITICAL RESULT CALLED TO, READ BACK BY AND VERIFIED WITH: NATE COOKSON ON 11/01/15 AT 0539 BY TLB    Serratia marcescens NOT DETECTED NOT DETECTED Final   Carbapenem resistance NOT DETECTED NOT DETECTED Final   Haemophilus influenzae NOT DETECTED NOT DETECTED Final   Neisseria meningitidis NOT DETECTED NOT DETECTED Final   Pseudomonas aeruginosa NOT DETECTED NOT DETECTED Final   Candida albicans NOT DETECTED NOT DETECTED Final   Candida glabrata NOT DETECTED NOT DETECTED Final   Candida krusei NOT DETECTED NOT DETECTED Final   Candida parapsilosis NOT DETECTED NOT DETECTED Final   Candida tropicalis NOT DETECTED NOT DETECTED Final  Blood culture (routine x 2)     Status: None (Preliminary result)   Collection Time: 10/31/15 12:13 PM  Result Value Ref Range Status   Specimen Description BLOOD RIGHT ASSIST CONTROL  Final   Special Requests BOTTLES DRAWN AEROBIC AND ANAEROBIC [place]  Final   Culture  Setup Time   Final    GRAM NEGATIVE RODS AEROBIC BOTTLE ONLY CRITICAL VALUE NOTED.  VALUE IS CONSISTENT WITH PREVIOUSLY REPORTED AND CALLED VALUE.    Culture   Final    GRAM NEGATIVE RODS IDENTIFICATION TO FOLLOW Performed at [facility]    Report Status PENDING  Incomplete  Urine culture     Status: Abnormal   Collection Time: 10/31/15 12:36 PM  Result Value Ref Range Status   Specimen Description URINE, CLEAN CATCH  Final   Special Requests NONE  Final   Culture >=100,000 COLONIES/mL PROTEUS MIRABILIS (A)  Final   Report Status 11/02/2015 FINAL  Final   Organism ID, Bacteria PROTEUS MIRABILIS (A)  Final      Susceptibility   Proteus mirabilis - MIC*    AMPICILLIN <=2 SENSITIVE Sensitive     CEFAZOLIN <=4 SENSITIVE Sensitive     CEFTRIAXONE <=1 SENSITIVE Sensitive      CIPROFLOXACIN <=0.25 SENSITIVE Sensitive     GENTAMICIN <=1 SENSITIVE Sensitive     IMIPENEM 2 SENSITIVE Sensitive     NITROFURANTOIN 128 RESISTANT Resistant     TRIMETH/SULFA <=20 SENSITIVE Sensitive     AMPICILLIN/SULBACTAM <=2 SENSITIVE Sensitive     PIP/TAZO <=4 SENSITIVE Sensitive     * >=100,000 COLONIES/mL PROTEUS MIRABILIS  Urine culture     Status: Abnormal   Collection Time: 10/31/15  3:01 PM  Result Value Ref Range Status   Specimen Description URINE, RANDOM  Final   Special Requests NONE  Final   Culture (A)  Final    <10,000 COLONIES/mL INSIGNIFICANT GROWTH Performed at [facility]    Report Status 11/01/2015 FINAL  Final    Radiology Reports Ct Abdomen Pelvis Wo Contrast  10/29/2015  CLINICAL DATA:  Right flank pain for 1 day EXAM: CT ABDOMEN AND PELVIS WITHOUT CONTRAST TECHNIQUE: Multidetector CT imaging of the abdomen and pelvis was performed following the standard protocol without oral or intravenous contrast material administration. COMPARISON:  None. FINDINGS: Lower chest:  Lung bases are clear. Hepatobiliary: No focal liver lesions are identified on this noncontrast enhanced study. Gallbladder wall is not appreciably thickened. There is no biliary duct dilatation. Pancreas: There is no pancreatic mass or inflammatory focus. Spleen: No splenic lesions are evident. Adrenals/Urinary Tract: Adrenals appear normal  bilaterally. There is no renal mass on either side. There is no hydronephrosis on the left. There is moderate hydronephrosis on the right. There is no intrarenal calculus on either side. There is a calculus at the right ureteropelvic junction measuring 6 x 5 mm. No other ureteral calculi are apparent on either side. Urinary bladder is midline with wall thickness within normal limits. Stomach/Bowel: There is no appreciable bowel wall or mesenteric thickening. No bowel obstruction. No free air or portal venous air. Vascular/Lymphatic: There are scattered foci of  atherosclerotic calcification in the aorta. The major mesenteric vessels appear patent on this noncontrast enhanced study. There is no appreciable adenopathy in the abdomen or pelvis. Reproductive: Uterus is anteverted. There is no pelvic mass or pelvic fluid collection. Other: Appendix appears unremarkable. There is no abscess or ascites in the abdomen or pelvis. Musculoskeletal: There is degenerative change at L5-S1 with vacuum phenomenon at this level. There is degenerative change at the pubic symphysis level as well. There is degenerative change in the left hip joint with subchondral cystic change. There are no blastic or lytic bone lesions. There is no intramuscular or abdominal wall lesion. IMPRESSION: 6 x 5 mm calculus at the right ureteropelvic junction with moderate hydronephrosis on the right. No bowel obstruction.  No abscess.  Appendix region appears normal. Areas of aortic atherosclerosis. Electronically Signed   By: Lowella Grip III M.D.   On: 10/29/2015 19:24   Ct Head Wo Contrast  10/31/2015  CLINICAL DATA:  Nausea vomiting and severe headache for 2 days, initial encounter EXAM: CT HEAD WITHOUT CONTRAST TECHNIQUE: Contiguous axial images were obtained from the base of the skull through the vertex without intravenous contrast. COMPARISON:  None. FINDINGS: The bony calvarium is intact. The ventricles are of normal size and configuration. No findings to suggest acute hemorrhage, acute infarction or space-occupying mass lesion are noted. IMPRESSION: No acute intracranial abnormality noted. Electronically Signed   By: Inez Catalina M.D.   On: 10/31/2015 10:21     CBC  Recent Labs Lab 10/31/15 0830 11/01/15 0439  WBC 13.5* 11.9*  HGB 13.8 12.7  HCT 40.0 36.5  PLT 110* 107*  MCV 88.1 87.7  MCH 30.4 30.5  MCHC 34.5 34.7  RDW 12.8 13.0  LYMPHSABS 0.3*  --   MONOABS 0.4  --   EOSABS 0.0  --   BASOSABS 0.0  --     Chemistries   Recent Labs Lab 10/31/15 0830 11/01/15 0439  NA  131* 140  K 3.6 4.7  CL 99* 112*  CO2 24 24  GLUCOSE 154* 164*  BUN 26* 18  CREATININE 1.44* 1.00  CALCIUM 8.8* 8.5*  AST 41  --   ALT 30  --   ALKPHOS 86  --   BILITOT 1.1  --    ------------------------------------------------------------------------------------------------------------------ estimated creatinine clearance is 79.4 mL/min (by C-G formula based on Cr of 1). ------------------------------------------------------------------------------------------------------------------ No results for input(s): HGBA1C in the last 72 hours. ------------------------------------------------------------------------------------------------------------------ No results for input(s): CHOL, HDL, LDLCALC, TRIG, CHOLHDL, LDLDIRECT in the last 72 hours. ------------------------------------------------------------------------------------------------------------------ No results for input(s): TSH, T4TOTAL, T3FREE, THYROIDAB in the last 72 hours.  Invalid input(s): FREET3 ------------------------------------------------------------------------------------------------------------------ No results for input(s): VITAMINB12, FOLATE, FERRITIN, TIBC, IRON, RETICCTPCT in the last 72 hours.  Coagulation profile No results for input(s): INR, PROTIME in the last 168 hours.  No results for input(s): DDIMER in the last 72 hours.  Cardiac Enzymes No results for input(s): CKMB, TROPONINI, MYOGLOBIN in the last 168 hours.  Invalid  input(s): CK ------------------------------------------------------------------------------------------------------------------ Invalid input(s): [place]  Patient is a 55 year old admitted with pyelonephritis with right ureteral stone   #1 .sepsis secondary to acute pyelonephritis And bacteremia Review of the blood and urine cultures continue IV antibiotics Both cultures are positive for Proteus, I will wait for sensitivity result and send a repeat  culture, blood to confirm the clearing.  #2. right ureteral stone with the hydronephrosis: Status post stent placement  Foley removal today #3.  GI, DVT prophylaxis.     Code Status Orders        Start     Ordered   10/31/15 1306  Full code   Continuous     10/31/15 1307    Code Status History    Date Active Date Inactive Code Status Order ID Comments User Context   This patient has a current code status but no historical code status.      Consults  48min  DVT Prophylaxis heprin  Lab Results  Component Value Date   PLT 107* 11/01/2015     Time Spent in minutes 80min Greater than 50% of time spent in care coordination and counseling patient regarding the condition and plan of care.   Vaughan Basta M.D on 11/02/2015 at 2:04 PM  Between 7am to 6pm - Pager - [phone]  After 6pm go to [email or web address] - password [email or web address] [facility]   Office  [phone]

## 2015-11-02 NOTE — Progress Notes (Signed)
The patient is receiving Famotidine by the intravenous route.  Based on criteria approved by the Pharmacy and [place], the medication is being converted to the equivalent oral dose form.  These criteria include: -No active GI bleeding -Able to tolerate diet of full liquids (or better) or tube feeding -Able to tolerate other medications by the oral or enteral route  Ordered Famotidine 20mg  PO BID to begin this morning.    Olivia Canter, [facility] 11/02/2015

## 2015-11-03 LAB — CULTURE, BLOOD (ROUTINE X 2)

## 2015-11-03 LAB — GLUCOSE, CAPILLARY: GLUCOSE-CAPILLARY: 101 mg/dL — AB (ref 65–99)

## 2015-11-03 MED ORDER — CIPROFLOXACIN HCL 500 MG PO TABS: 500.0000 mg | ORAL_TABLET | Freq: Two times a day (BID) | ORAL | Status: DC

## 2015-11-03 MED ORDER — OXYCODONE-ACETAMINOPHEN 5-325 MG PO TABS: 1.0000 | ORAL_TABLET | Freq: Four times a day (QID) | ORAL | Status: DC | PRN

## 2015-11-03 NOTE — Progress Notes (Signed)
11/03/2015 10:48 AM  BP 133/62 mmHg  Pulse 73  Temp(Src) 98.5 F (36.9 C) (Oral)  Resp 18  Ht 5\' 6"  (1.676 m)  Wt 103.511 kg (228 lb 3.2 oz)  BMI 36.85 kg/m2  SpO2 95% Patient discharged per MD orders. Discharge instructions reviewed with patient and patient verbalized understanding. IV removed per policy. Prescriptions discussed and given to patient. Discharged via wheelchair escorted by [employer] nursing student.  Almedia Balls, RN

## 2015-11-03 NOTE — Discharge Summary (Signed)
[place] [facility] [place] NAME: Rhonda Schneider    MR#:  [id number]  DATE OF BIRTH:  28-Dec-1960  DATE OF ADMISSION:  10/31/2015 ADMITTING PHYSICIAN: Nickie Retort, MD  DATE OF DISCHARGE: 11/03/2015  PRIMARY CARE PHYSICIAN: No PCP Per Patient    ADMISSION DIAGNOSIS:  Ureterolithiasis [N20.1] Flank pain, acute [R10.10] Nausea vomiting and diarrhea [R11.2, R19.7] Sepsis, due to unspecified organism ([place]) [A41.9] Acute nonintractable headache, unspecified headache type [R51]  DISCHARGE DIAGNOSIS:  Active Problems:   Acute pyelonephritis   SECONDARY DIAGNOSIS:   Past Medical History  Diagnosis Date  . Hypertension     no medications    HOSPITAL COURSE:   #1 .sepsis secondary to acute pyelonephritis And bacteremia Review of the blood and urine cultures continue IV antibiotics Both cultures are positive for Proteus, sensitive to cephalosporin and cipro.  repeat blood cx are negative for 24 hours.   Advised oral Abx to finish total 14 days course.  #2. right ureteral stone with the hydronephrosis: Status post stent placement  Foley removal Advised to follow in Urology clinic in 2 weeks.  #3. GI, DVT prophylaxis.   DISCHARGE CONDITIONS:   Stable.  CONSULTS OBTAINED:     DRUG ALLERGIES:  No Known Allergies  DISCHARGE MEDICATIONS:   Current Discharge Medication List    START taking these medications   Details  ciprofloxacin (CIPRO) 500 MG tablet Take 1 tablet (500 mg total) by mouth 2 (two) times daily. For 11 days. Qty: 22 tablet, Refills: 0      CONTINUE these medications which have CHANGED   Details  oxyCODONE-acetaminophen (ROXICET) 5-325 MG tablet Take 1 tablet by mouth every 6 (six) hours as needed for severe pain. Qty: 20 tablet, Refills: 0      CONTINUE these medications which have NOT CHANGED   Details  ketorolac (TORADOL) 10 MG tablet Take 1 tablet (10 mg total) by mouth every 6 (six) hours  as needed. Qty: 20 tablet, Refills: 0    simvastatin (ZOCOR) 20 MG tablet Take 20 mg by mouth at bedtime.     tamsulosin (FLOMAX) 0.4 MG CAPS capsule Take 1 capsule (0.4 mg total) by mouth daily. Qty: 15 capsule, Refills: 0         DISCHARGE INSTRUCTIONS:    Follow with Urology in 2 weeks.  If you experience worsening of your admission symptoms, develop shortness of breath, life threatening emergency, suicidal or homicidal thoughts you must seek medical attention immediately by calling 911 or calling your MD immediately  if symptoms less severe.  You Must read complete instructions/literature along with all the possible adverse reactions/side effects for all the Medicines you take and that have been prescribed to you. Take any new Medicines after you have completely understood and accept all the possible adverse reactions/side effects.   Please note  You were cared for by a hospitalist during your hospital stay. If you have any questions about your discharge medications or the care you received while you were in the hospital after you are discharged, you can call the unit and asked to speak with the hospitalist on call if the hospitalist that took care of you is not available. Once you are discharged, your primary care physician will handle any further medical issues. Please note that NO REFILLS for any discharge medications will be authorized once you are discharged, as it is imperative that you return to your primary care physician (or establish a relationship with a primary  care physician if you do not have one) for your aftercare needs so that they can reassess your need for medications and monitor your lab values.    Today   CHIEF COMPLAINT:   Chief Complaint  Patient presents with  . Emesis  . Headache  . Flank Pain    HISTORY OF PRESENT ILLNESS:  Rhonda Schneider  is a 55 y.o. female with a known history of Hyperlipidemia, recently diagnosed kidney stone on the right side  she was in the emergency room on July 4 sent home with pain medication and Flomax comes back today with worsening right flank pain, persistent nausea, vomiting on and off fever. Patient tachycardic in the emergency room up to 1 20 bpm, limited white count up to 13.5, worsening renal failure with BUN 26 and creatinine 1.4. Patient shouldn't have CT abdomen for right flank pain on July 4, it showed 6 mm calculus in the right ureter pelvic junction with moderate hydronephrosis on the right side. Patient is seen by urologist today, patient is scheduled to have cystoscopy, ureteral stent placement. Patient is having Lots of pain in the right flank, tachycardia. And also has headache. Head CT is done in the emergency room which is negative.    VITAL SIGNS:  Blood pressure 133/62, pulse 73, temperature 98.5 F (36.9 C), temperature source Oral, resp. rate 18, height 5\' 6"  (1.676 m), weight 103.511 kg (228 lb 3.2 oz), SpO2 95 %.  I/O:   Intake/Output Summary (Last 24 hours) at 11/03/15 0952 Last data filed at 11/03/15 0531  Gross per 24 hour  Intake 3962.5 ml  Output    250 ml  Net 3712.5 ml    PHYSICAL EXAMINATION:  GENERAL:  55 y.o.-year-old patient lying in the bed with no acute distress.  EYES: Pupils equal, round, reactive to light and accommodation. No scleral icterus. Extraocular muscles intact.  HEENT: Head atraumatic, normocephalic. Oropharynx and nasopharynx clear.  NECK:  Supple, no jugular venous distention. No thyroid enlargement, no tenderness.  LUNGS: Normal breath sounds bilaterally, no wheezing, rales,rhonchi or crepitation. No use of accessory muscles of respiration.  CARDIOVASCULAR: S1, S2 normal. No murmurs, rubs, or gallops.  ABDOMEN: Soft, non-tender, non-distended. Bowel sounds present. No organomegaly or mass.  EXTREMITIES: No pedal edema, cyanosis, or clubbing.  NEUROLOGIC: Cranial nerves II through XII are intact. Muscle strength 5/5 in all extremities. Sensation intact.  Gait not checked.  PSYCHIATRIC: The patient is alert and oriented x 3.  SKIN: No obvious rash, lesion, or ulcer.   DATA REVIEW:   CBC  Recent Labs Lab 11/01/15 0439  WBC 11.9*  HGB 12.7  HCT 36.5  PLT 107*    Chemistries   Recent Labs Lab 10/31/15 0830 11/01/15 0439  NA 131* 140  K 3.6 4.7  CL 99* 112*  CO2 24 24  GLUCOSE 154* 164*  BUN 26* 18  CREATININE 1.44* 1.00  CALCIUM 8.8* 8.5*  AST 41  --   ALT 30  --   ALKPHOS 86  --   BILITOT 1.1  --     Cardiac Enzymes No results for input(s): TROPONINI in the last 168 hours.  Microbiology Results  Results for orders placed or performed during the hospital encounter of 10/31/15  Blood culture (routine x 2)     Status: Abnormal   Collection Time: 10/31/15 12:12 PM  Result Value Ref Range Status   Specimen Description BLOOD LEFT ASSIST CONTROL  Final   Special Requests BOTTLES DRAWN AEROBIC AND ANAEROBIC [place]  Final   Culture  Setup Time   Final    GRAM NEGATIVE RODS ANAEROBIC BOTTLE ONLY CRITICAL RESULT CALLED TO, READ BACK BY AND VERIFIED WITH: NATE COOKSON 11/01/15 0539 TLB    Culture PROTEUS MIRABILIS (A)  Final   Report Status 11/03/2015 FINAL  Final   Organism ID, Bacteria PROTEUS MIRABILIS  Final      Susceptibility   Proteus mirabilis - MIC*    AMPICILLIN <=2 SENSITIVE Sensitive     CEFAZOLIN <=4 SENSITIVE Sensitive     CEFEPIME <=1 SENSITIVE Sensitive     CEFTAZIDIME <=1 SENSITIVE Sensitive     CEFTRIAXONE <=1 SENSITIVE Sensitive     CIPROFLOXACIN <=0.25 SENSITIVE Sensitive     GENTAMICIN <=1 SENSITIVE Sensitive     IMIPENEM 2 SENSITIVE Sensitive     TRIMETH/SULFA <=20 SENSITIVE Sensitive     AMPICILLIN/SULBACTAM <=2 SENSITIVE Sensitive     PIP/TAZO <=4 SENSITIVE Sensitive     * PROTEUS MIRABILIS  Blood Culture ID Panel (Reflexed)     Status: Abnormal   Collection Time: 10/31/15 12:12 PM  Result Value Ref Range Status   Enterococcus species NOT DETECTED NOT DETECTED Final   Vancomycin resistance  NOT DETECTED NOT DETECTED Final   Listeria monocytogenes NOT DETECTED NOT DETECTED Final   Staphylococcus species NOT DETECTED NOT DETECTED Final   Staphylococcus aureus NOT DETECTED NOT DETECTED Final   Methicillin resistance NOT DETECTED NOT DETECTED Final   Streptococcus species NOT DETECTED NOT DETECTED Final   Streptococcus agalactiae NOT DETECTED NOT DETECTED Final   Streptococcus pneumoniae NOT DETECTED NOT DETECTED Final   Streptococcus pyogenes NOT DETECTED NOT DETECTED Final   Acinetobacter baumannii NOT DETECTED NOT DETECTED Final   Enterobacteriaceae species DETECTED (A) NOT DETECTED Final    Comment: CRITICAL RESULT CALLED TO, READ BACK BY AND VERIFIED WITH: NATE COOKSON ON 11/01/15 AT 0539 BY TLB    Enterobacter cloacae complex NOT DETECTED NOT DETECTED Final   Escherichia coli NOT DETECTED NOT DETECTED Final   Klebsiella oxytoca NOT DETECTED NOT DETECTED Final   Klebsiella pneumoniae NOT DETECTED NOT DETECTED Final   Proteus species DETECTED (A) NOT DETECTED Final    Comment: CRITICAL RESULT CALLED TO, READ BACK BY AND VERIFIED WITH: NATE COOKSON ON 11/01/15 AT 0539 BY TLB    Serratia marcescens NOT DETECTED NOT DETECTED Final   Carbapenem resistance NOT DETECTED NOT DETECTED Final   Haemophilus influenzae NOT DETECTED NOT DETECTED Final   Neisseria meningitidis NOT DETECTED NOT DETECTED Final   Pseudomonas aeruginosa NOT DETECTED NOT DETECTED Final   Candida albicans NOT DETECTED NOT DETECTED Final   Candida glabrata NOT DETECTED NOT DETECTED Final   Candida krusei NOT DETECTED NOT DETECTED Final   Candida parapsilosis NOT DETECTED NOT DETECTED Final   Candida tropicalis NOT DETECTED NOT DETECTED Final  Blood culture (routine x 2)     Status: Abnormal   Collection Time: 10/31/15 12:13 PM  Result Value Ref Range Status   Specimen Description BLOOD RIGHT ASSIST CONTROL  Final   Special Requests BOTTLES DRAWN AEROBIC AND ANAEROBIC [place]  Final   Culture  Setup Time   Final     GRAM NEGATIVE RODS AEROBIC BOTTLE ONLY CRITICAL VALUE NOTED.  VALUE IS CONSISTENT WITH PREVIOUSLY REPORTED AND CALLED VALUE.    Culture (A)  Final    PROTEUS MIRABILIS SUSCEPTIBILITIES PERFORMED ON PREVIOUS CULTURE WITHIN THE LAST 5 DAYS. Performed at [facility]    Report Status 11/03/2015 FINAL  Final  Urine culture  Status: Abnormal   Collection Time: 10/31/15 12:36 PM  Result Value Ref Range Status   Specimen Description URINE, CLEAN CATCH  Final   Special Requests NONE  Final   Culture >=100,000 COLONIES/mL PROTEUS MIRABILIS (A)  Final   Report Status 11/02/2015 FINAL  Final   Organism ID, Bacteria PROTEUS MIRABILIS (A)  Final      Susceptibility   Proteus mirabilis - MIC*    AMPICILLIN <=2 SENSITIVE Sensitive     CEFAZOLIN <=4 SENSITIVE Sensitive     CEFTRIAXONE <=1 SENSITIVE Sensitive     CIPROFLOXACIN <=0.25 SENSITIVE Sensitive     GENTAMICIN <=1 SENSITIVE Sensitive     IMIPENEM 2 SENSITIVE Sensitive     NITROFURANTOIN 128 RESISTANT Resistant     TRIMETH/SULFA <=20 SENSITIVE Sensitive     AMPICILLIN/SULBACTAM <=2 SENSITIVE Sensitive     PIP/TAZO <=4 SENSITIVE Sensitive     * >=100,000 COLONIES/mL PROTEUS MIRABILIS  Urine culture     Status: Abnormal   Collection Time: 10/31/15  3:01 PM  Result Value Ref Range Status   Specimen Description URINE, RANDOM  Final   Special Requests NONE  Final   Culture (A)  Final    <10,000 COLONIES/mL INSIGNIFICANT GROWTH Performed at [facility]    Report Status 11/01/2015 FINAL  Final  CULTURE, BLOOD (ROUTINE X 2) w Reflex to ID Panel     Status: None (Preliminary result)   Collection Time: 11/02/15 10:26 AM  Result Value Ref Range Status   Specimen Description BLOOD RIGHT ANTECUBITAL  Final   Special Requests BOTTLES DRAWN AEROBIC AND ANAEROBIC  5CC  Final   Culture NO GROWTH < 24 HOURS  Final   Report Status PENDING  Incomplete  CULTURE, BLOOD (ROUTINE X 2) w Reflex to ID Panel     Status: None  (Preliminary result)   Collection Time: 11/02/15 10:29 AM  Result Value Ref Range Status   Specimen Description BLOOD RIGHT HAND  Final   Special Requests BOTTLES DRAWN AEROBIC AND ANAEROBIC  2CC  Final   Culture NO GROWTH < 24 HOURS  Final   Report Status PENDING  Incomplete    RADIOLOGY:  No results found.  EKG:  No orders found for this or any previous visit.    Management plans discussed with the patient, family and they are in agreement.  CODE STATUS:     Code Status Orders        Start     Ordered   10/31/15 1306  Full code   Continuous     10/31/15 1307    Code Status History    Date Active Date Inactive Code Status Order ID Comments User Context   This patient has a current code status but no historical code status.      TOTAL TIME TAKING CARE OF THIS PATIENT: 35 minutes.    Vaughan Basta M.D on 11/03/2015 at 9:52 AM  Between 7am to 6pm - Pager - [phone]  After 6pm go to [email or web address] - password [email or web address] [place] [facility]  Office  [phone]  CC: Primary care physician; No PCP Per Patient   Note: This dictation was prepared with Dragon dictation along with smaller phrase technology. Any transcriptional errors that result from this process are unintentional.

## 2015-11-03 NOTE — Progress Notes (Signed)
[place], [place].   11/03/2015  Patient: Rhonda Schneider   Date of Birth:  15-Feb-1961  Date of admission:  10/31/2015  Date of Discharge  11/03/2015    To Whom it May Concern:   [name]  may return to work on 11/12/15.  Physical Activities- no restrictions.  Need further follow ups with Urology clinic and need further procedures in near future.  If you have any questions or concerns, please don't hesitate to call.  Sincerely,   Vaughan Basta M.D Pager [phone] Office : [phone]   .

## 2015-11-07 ENCOUNTER — Encounter: Payer: Self-pay | Admitting: Urology

## 2015-11-07 ENCOUNTER — Ambulatory Visit (INDEPENDENT_AMBULATORY_CARE_PROVIDER_SITE_OTHER): Payer: 59 | Admitting: Urology

## 2015-11-07 VITALS — BP 103/71 | HR 85 | Ht 65.0 in | Wt 229.0 lb

## 2015-11-07 DIAGNOSIS — D494 Neoplasm of unspecified behavior of bladder: Secondary | ICD-10-CM

## 2015-11-07 DIAGNOSIS — N200 Calculus of kidney: Secondary | ICD-10-CM

## 2015-11-07 LAB — URINALYSIS, COMPLETE
BILIRUBIN UA: NEGATIVE
GLUCOSE, UA: NEGATIVE
KETONES UA: NEGATIVE
Nitrite, UA: NEGATIVE
PH UA: 5.5 (ref 5.0–7.5)
SPEC GRAV UA: 1.02 (ref 1.005–1.030)
UUROB: 0.2 mg/dL (ref 0.2–1.0)

## 2015-11-07 LAB — CULTURE, BLOOD (ROUTINE X 2)
CULTURE: NO GROWTH
Culture: NO GROWTH

## 2015-11-07 LAB — MICROSCOPIC EXAMINATION: RBC, UA: >30 /hpf — AB (ref 0–?)

## 2015-11-07 NOTE — Telephone Encounter (Signed)
Pt notified of surgery scheduled 11/13/15, pre-admit testing phone interview on 11/11/15 between 9am-1pm & to call day prior to surgery for arrival time to SDS. Pt voices understanding.

## 2015-11-07 NOTE — Progress Notes (Signed)
11/07/2015 9:21 AM   Rhonda Schneider 19-Nov-1960 [id number]  Referring provider: No referring provider defined for this encounter.  No chief complaint on file.   HPI: The patient is a 55 year old female was recently admitted with a septic right ureteral stone. At that time a right ureteral stent was placed. Her urine culture grew Proteus sensitive to Ancef. 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   PMH: Past Medical History  Diagnosis Date  . Hypertension     no medications    Surgical History: Past Surgical History  Procedure Laterality Date  . Tubal ligation    . Appendectomy  1978    ruptured  . Cesarean section  1987  . Cystoscopy with stent placement Right 10/31/2015    Procedure: CYSTOSCOPY WITH STENT PLACEMENT;  Surgeon: Nickie Retort, MD;  Location: [facility] ORS;  Service: Urology;  Laterality: Right;    Home Medications:    Medication List       This list is accurate as of: 11/07/15  9:21 AM.  Always use your most recent med list.               ciprofloxacin 500 MG tablet  Commonly known as:  CIPRO  Take 1 tablet (500 mg total) by mouth 2 (two) times daily. For 11 days.     ketorolac 10 MG tablet  Commonly known as:  TORADOL  Take 1 tablet (10 mg total) by mouth every 6 (six) hours as needed.     oxyCODONE-acetaminophen 5-325 MG tablet  Commonly known as:  ROXICET  Take 1 tablet by mouth every 6 (six) hours as needed for severe pain.     simvastatin 20 MG tablet  Commonly known as:  ZOCOR  Take 20 mg by mouth at bedtime.     tamsulosin 0.4 MG Caps capsule  Commonly known as:  FLOMAX  Take 1 capsule (0.4 mg total) by mouth daily.        Allergies: No Known Allergies  Family History: No family history on file.  Social History:  reports that she has never smoked. She does not have any smokeless tobacco history on file. She reports that she does not drink alcohol. Her drug history is not on  file.  ROS:                                        Physical Exam: There were no vitals taken for this visit.  Constitutional:  Alert and oriented, No acute distress. HEENT: [place] AT, moist mucus membranes.  Trachea midline, no masses. Cardiovascular: No clubbing, cyanosis, or edema. Respiratory: Normal respiratory effort, no increased work of breathing. GI: Abdomen is soft, nontender, nondistended, no abdominal masses GU: No CVA tenderness.  Skin: No rashes, bruises or suspicious lesions. Lymph: No cervical or inguinal adenopathy. Neurologic: Grossly intact, no focal deficits, moving all 4 extremities. Psychiatric: Normal mood and affect.  Laboratory Data: Lab Results  Component Value Date   WBC 11.9* 11/01/2015   HGB 12.7 11/01/2015   HCT 36.5 11/01/2015   MCV 87.7 11/01/2015   PLT 107* 11/01/2015    Lab Results  Component Value Date   CREATININE 1.00 11/01/2015    No results found for: PSA  No results found for: TESTOSTERONE  No results found for: HGBA1C  Urinalysis    Component Value Date/Time  COLORURINE YELLOW* 10/31/2015 1104   APPEARANCEUR HAZY* 10/31/2015 1104   LABSPEC 1.008 10/31/2015 1104   PHURINE 7.0 10/31/2015 1104   GLUCOSEU NEGATIVE 10/31/2015 1104   HGBUR 2+* 10/31/2015 [place] 10/31/2015 [place] 10/31/2015 1104   PROTEINUR 30* 10/31/2015 1104   NITRITE NEGATIVE 10/31/2015 1104   LEUKOCYTESUR 3+* 10/31/2015 1104      Assessment & Plan:    1. Right ureteral stone 2. Bladder tumor I had a long discussion with the patient regarding definitive stone management for her right ureteral stone. Discussed cystoscopy, ureteroscopy, laser lithotripsy, and right ureteral stent exchange. She also has an incidental finding of a small tumor in her bladder which she will need transurethral resection of bladder tumor at the time of above. All risks, benefits, indications of these procedures were  discussed the patient. All questions were answered. She understands the risks including but limited to bleeding, infection, bladder injury, need for Foley catheter. All questions were answered. She has elected to proceed.     Nickie Retort, MD  [facility] [address] [place], [place] [address] [phone]

## 2015-11-11 ENCOUNTER — Other Ambulatory Visit: Payer: 59

## 2015-11-11 ENCOUNTER — Encounter: Payer: Self-pay | Admitting: *Deleted

## 2015-11-11 LAB — CULTURE, URINE COMPREHENSIVE

## 2015-11-12 NOTE — Patient Instructions (Signed)
  Your procedure is scheduled on: 11-13-15 Report to Same Day Surgery 2nd floor medical mall To find out your arrival time please call [phone] between 1PM - 3PM on 11-12-15  Remember: Instructions that are not followed completely may result in serious medical risk, up to and including death, or upon the discretion of your surgeon and anesthesiologist your surgery may need to be rescheduled.    _x___ 1. Do not eat food or drink liquids after midnight. No gum chewing or hard candies.     __x__ 2. No Alcohol for 24 hours before or after surgery.   __x__3. No Smoking for 24 prior to surgery.   ____  4. Bring all medications with you on the day of surgery if instructed.    __x__ 5. Notify your doctor if there is any change in your medical condition     (cold, fever, infections).     Do not wear jewelry, make-up, hairpins, clips or nail polish.  Do not wear lotions, powders, or perfumes. You may wear deodorant.  Do not shave 48 hours prior to surgery. Men may shave face and neck.  Do not bring valuables to the hospital.    [facility] is not responsible for any belongings or valuables.               Contacts, dentures or bridgework may not be worn into surgery.  Leave your suitcase in the car. After surgery it may be brought to your room.  For patients admitted to the hospital, discharge time is determined by your treatment team.   Patients discharged the day of surgery will not be allowed to drive home.    Please read over the following fact sheets that you were given:   [facility] Preparing for Surgery and or MRSA Information   ____ Take these medicines the morning of surgery with A SIP OF WATER:    1. NONE  2.  3.  4.  5.  6.  ____ Fleet Enema (as directed)   ____ Use CHG Soap or sage wipes as directed on instruction sheet   ____ Use inhalers on the day of surgery and bring to hospital day of surgery  ____ Stop metformin 2 days prior to surgery    ____ Take 1/2 of  usual insulin dose the night before surgery and none on the morning of surgery.   ____ Stop aspirin or coumadin, or plavix  _x__ Stop Anti-inflammatories such as Advil, Aleve, Ibuprofen, Motrin, Naproxen,          Naprosyn, Goodies powders or aspirin products. Ok to take Tylenol.   ____ Stop supplements until after surgery.    ____ Bring C-Pap to the hospital.

## 2015-11-13 ENCOUNTER — Encounter
Admission: RE | Disposition: A | Discharge status: Home / Self Care | Payer: Self-pay | Source: Ambulatory Visit | Attending: Urology

## 2015-11-13 ENCOUNTER — Ambulatory Visit: Payer: 59 | Admitting: Anesthesiology

## 2015-11-13 ENCOUNTER — Ambulatory Visit
Admission: RE | Admit: 2015-11-13 | Discharge: 2015-11-13 | Disposition: A | Discharge status: Home / Self Care | Payer: 59 | Source: Ambulatory Visit | Attending: Urology | Admitting: Urology

## 2015-11-13 DIAGNOSIS — D494 Neoplasm of unspecified behavior of bladder: Secondary | ICD-10-CM | POA: Diagnosis not present

## 2015-11-13 DIAGNOSIS — Z9851 Tubal ligation status: Secondary | ICD-10-CM | POA: Insufficient documentation

## 2015-11-13 DIAGNOSIS — N201 Calculus of ureter: Secondary | ICD-10-CM | POA: Diagnosis not present

## 2015-11-13 DIAGNOSIS — C672 Malignant neoplasm of lateral wall of bladder: Secondary | ICD-10-CM | POA: Diagnosis not present

## 2015-11-13 DIAGNOSIS — I10 Essential (primary) hypertension: Secondary | ICD-10-CM | POA: Insufficient documentation

## 2015-11-13 DIAGNOSIS — C679 Malignant neoplasm of bladder, unspecified: Secondary | ICD-10-CM | POA: Insufficient documentation

## 2015-11-13 DIAGNOSIS — Z79899 Other long term (current) drug therapy: Secondary | ICD-10-CM | POA: Insufficient documentation

## 2015-11-13 DIAGNOSIS — Z87891 Personal history of nicotine dependence: Secondary | ICD-10-CM | POA: Insufficient documentation

## 2015-11-13 HISTORY — PX: CYSTOSCOPY W/ URETERAL STENT PLACEMENT: SHX1429

## 2015-11-13 HISTORY — PX: TRANSURETHRAL RESECTION OF BLADDER TUMOR: SHX2575

## 2015-11-13 HISTORY — PX: URETEROSCOPY WITH HOLMIUM LASER LITHOTRIPSY: SHX6645

## 2015-11-13 SURGERY — URETEROSCOPY, WITH LITHOTRIPSY USING HOLMIUM LASER
Anesthesia: General | Laterality: Right

## 2015-11-13 MED ORDER — FAMOTIDINE 20 MG PO TABS
20.0000 mg | ORAL_TABLET | Freq: Once | ORAL | Status: AC
  Administered 2015-11-13: 20 mg via ORAL

## 2015-11-13 MED ORDER — FENTANYL CITRATE (PF) 100 MCG/2ML IJ SOLN: 25.0000 ug | INTRAMUSCULAR | Status: DC | PRN

## 2015-11-13 MED ORDER — LACTATED RINGERS IV SOLN
INTRAVENOUS | Status: DC
  Administered 2015-11-13: 10:00:00 via INTRAVENOUS

## 2015-11-13 MED ORDER — HYDROCODONE-ACETAMINOPHEN 5-325 MG PO TABS: 1.0000 | ORAL_TABLET | ORAL | Status: DC | PRN

## 2015-11-13 MED ORDER — MIDAZOLAM HCL 2 MG/2ML IJ SOLN
INTRAMUSCULAR | Status: DC | PRN
  Administered 2015-11-13: 2 mg via INTRAVENOUS

## 2015-11-13 MED ORDER — FENTANYL CITRATE (PF) 100 MCG/2ML IJ SOLN
INTRAMUSCULAR | Status: DC | PRN
  Administered 2015-11-13: 50 ug via INTRAVENOUS

## 2015-11-13 MED ORDER — DEXAMETHASONE SODIUM PHOSPHATE 10 MG/ML IJ SOLN
INTRAMUSCULAR | Status: DC | PRN
  Administered 2015-11-13: 5 mg via INTRAVENOUS

## 2015-11-13 MED ORDER — CEFAZOLIN SODIUM-DEXTROSE 2-4 GM/100ML-% IV SOLN
INTRAVENOUS | Status: AC
  Administered 2015-11-13: 2 g via INTRAVENOUS
  Filled 2015-11-13: qty 100

## 2015-11-13 MED ORDER — CIPROFLOXACIN HCL 500 MG PO TABS: 500.0000 mg | ORAL_TABLET | Freq: Two times a day (BID) | ORAL | Status: DC

## 2015-11-13 MED ORDER — PROPOFOL 10 MG/ML IV BOLUS
INTRAVENOUS | Status: DC | PRN
  Administered 2015-11-13: 150 mg via INTRAVENOUS

## 2015-11-13 MED ORDER — LIDOCAINE HCL (CARDIAC) 20 MG/ML IV SOLN
INTRAVENOUS | Status: DC | PRN
  Administered 2015-11-13: 80 mg via INTRAVENOUS

## 2015-11-13 MED ORDER — ONDANSETRON HCL 4 MG/2ML IJ SOLN
INTRAMUSCULAR | Status: DC | PRN
Start: 2015-11-13 — End: 2015-11-13
  Administered 2015-11-13: 4 mg via INTRAVENOUS

## 2015-11-13 MED ORDER — CEFAZOLIN SODIUM-DEXTROSE 2-4 GM/100ML-% IV SOLN
2.0000 g | Freq: Once | INTRAVENOUS | Status: AC
  Administered 2015-11-13: 2 g via INTRAVENOUS

## 2015-11-13 MED ORDER — FAMOTIDINE 20 MG PO TABS
ORAL_TABLET | ORAL | Status: AC
  Administered 2015-11-13: 20 mg via ORAL
  Filled 2015-11-13: qty 1

## 2015-11-13 MED ORDER — ONDANSETRON HCL 4 MG/2ML IJ SOLN: 4.0000 mg | Freq: Once | INTRAMUSCULAR | Status: DC | PRN

## 2015-11-13 SURGICAL SUPPLY — 45 items
BACTOSHIELD CHG 4% 4OZ (MISCELLANEOUS) ×2
BAG DRAIN CYSTO-URO LG1000N (MISCELLANEOUS) ×4 IMPLANT
BAG URO DRAIN 2000ML W/SPOUT (MISCELLANEOUS) IMPLANT
BASKET ZERO TIP 1.9FR (BASKET) ×4 IMPLANT
CATH FOL LEG HOLDER (MISCELLANEOUS) IMPLANT
CATH FOLEY 2WAY  5CC 16FR (CATHETERS)
CATH FOLEY 3WAY 30CC 24FR (CATHETERS)
CATH URETL 5X70 OPEN END (CATHETERS) ×4 IMPLANT
CATH URTH 16FR FL 2W BLN LF (CATHETERS) IMPLANT
CATH URTH STD 24FR FL 3W 2 (CATHETERS) IMPLANT
CNTNR SPEC 2.5X3XGRAD LEK (MISCELLANEOUS) ×2
CONT SPEC 4OZ STER OR WHT (MISCELLANEOUS) ×2
CONTAINER SPEC 2.5X3XGRAD LEK (MISCELLANEOUS) ×2 IMPLANT
ELECT LOOP 22F BIPOLAR SML (ELECTROSURGICAL) ×4
ELECT REM PT RETURN 9FT ADLT (ELECTROSURGICAL) ×4
ELECTRODE LOOP 22F BIPOLAR SML (ELECTROSURGICAL) ×2 IMPLANT
ELECTRODE REM PT RTRN 9FT ADLT (ELECTROSURGICAL) ×2 IMPLANT
EVACUATOR ELLICK (MISCELLANEOUS) ×4 IMPLANT
FEE TECHNICIAN ONLY PER HOUR (MISCELLANEOUS) IMPLANT
GLOVE BIO SURGEON STRL SZ7 (GLOVE) ×4 IMPLANT
GLOVE BIO SURGEON STRL SZ7.5 (GLOVE) ×4 IMPLANT
GOWN STRL REUS W/ TWL LRG LVL3 (GOWN DISPOSABLE) ×4 IMPLANT
GOWN STRL REUS W/ TWL LRG LVL4 (GOWN DISPOSABLE) ×2 IMPLANT
GOWN STRL REUS W/TWL LRG LVL3 (GOWN DISPOSABLE) ×4
GOWN STRL REUS W/TWL LRG LVL4 (GOWN DISPOSABLE) ×2
GOWN STRL REUS W/TWL XL LVL3 (GOWN DISPOSABLE) ×4 IMPLANT
GUIDEWIRE SUPER STIFF (WIRE) IMPLANT
INTRODUCER DILATOR DOUBLE (INTRODUCER) ×4 IMPLANT
KIT RM TURNOVER CYSTO AR (KITS) ×4 IMPLANT
LASER FIBER 200M SMARTSCOPE (Laser) ×4 IMPLANT
LOOP CUT BIPOLAR 24F LRG (ELECTROSURGICAL) IMPLANT
PACK CYSTO AR (MISCELLANEOUS) ×4 IMPLANT
SCRUB CHG 4% DYNA-HEX 4OZ (MISCELLANEOUS) ×2 IMPLANT
SENSORWIRE 0.038 NOT ANGLED (WIRE) ×4
SET CYSTO W/LG BORE CLAMP LF (SET/KITS/TRAYS/PACK) ×4 IMPLANT
SET IRRIG Y TYPE TUR BLADDER L (SET/KITS/TRAYS/PACK) ×4 IMPLANT
SHEATH URETERAL 13/15X36 1L (SHEATH) IMPLANT
SOL .9 NS 3000ML IRR  AL (IV SOLUTION) ×4
SOL .9 NS 3000ML IRR UROMATIC (IV SOLUTION) ×4 IMPLANT
STENT URET 6FRX24 CONTOUR (STENTS) IMPLANT
STENT URET 6FRX26 CONTOUR (STENTS) ×4 IMPLANT
SURGILUBE 2OZ TUBE FLIPTOP (MISCELLANEOUS) ×4 IMPLANT
SYRINGE IRR TOOMEY STRL 70CC (SYRINGE) ×4 IMPLANT
WATER STERILE IRR 1000ML POUR (IV SOLUTION) ×4 IMPLANT
WIRE SENSOR 0.038 NOT ANGLED (WIRE) ×2 IMPLANT

## 2015-11-13 NOTE — Anesthesia Procedure Notes (Signed)
Procedure Name: LMA Insertion Date/Time: 11/13/2015 11:41 AM Performed by: Aline Brochure Pre-anesthesia Checklist: Patient identified, Emergency Drugs available, Suction available and Patient being monitored Patient Re-evaluated:Patient Re-evaluated prior to inductionOxygen Delivery Method: Circle system utilized Preoxygenation: Pre-oxygenation with 100% oxygen Intubation Type: IV induction Ventilation: Mask ventilation without difficulty LMA: LMA inserted LMA Size: 3.5 Number of attempts: 1 Placement Confirmation: positive ETCO2 and breath sounds checked- equal and bilateral Tube secured with: Tape Dental Injury: Teeth and Oropharynx as per pre-operative assessment

## 2015-11-13 NOTE — Anesthesia Preprocedure Evaluation (Signed)
Anesthesia Evaluation  Patient identified by MRN, date of birth, ID band Patient awake    Reviewed: Allergy & Precautions, H&P , NPO status , Patient's Chart, lab work & pertinent test results, reviewed documented beta blocker date and time   History of Anesthesia Complications Negative for: history of anesthetic complications  Airway Mallampati: III  TM Distance: >3 FB Neck ROM: full    Dental no notable dental hx. (+) Chipped   Pulmonary neg pulmonary ROS, former smoker,    Pulmonary exam normal breath sounds clear to auscultation       Cardiovascular Exercise Tolerance: Good negative cardio ROS Normal cardiovascular exam Rhythm:regular Rate:Normal     Neuro/Psych negative neurological ROS  negative psych ROS   GI/Hepatic negative GI ROS, Neg liver ROS,   Endo/Other  negative endocrine ROS  Renal/GU Renal disease (kidney stones)  negative genitourinary   Musculoskeletal   Abdominal   Peds  Hematology negative hematology ROS (+)   Anesthesia Other Findings Past Medical History:   Kidney stone                                                 Reproductive/Obstetrics negative OB ROS                             Anesthesia Physical Anesthesia Plan  ASA: II  Anesthesia Plan: General   Post-op Pain Management:    Induction:   Airway Management Planned:   Additional Equipment:   Intra-op Plan:   Post-operative Plan:   Informed Consent: I have reviewed the patients History and Physical, chart, labs and discussed the procedure including the risks, benefits and alternatives for the proposed anesthesia with the patient or authorized representative who has indicated his/her understanding and acceptance.   Dental Advisory Given  Plan Discussed with: Anesthesiologist, CRNA and Surgeon  Anesthesia Plan Comments:         Anesthesia Quick Evaluation

## 2015-11-13 NOTE — Discharge Instructions (Signed)

## 2015-11-13 NOTE — Interval H&P Note (Signed)
History and Physical Interval Note:  11/13/2015 11:13 AM  Rhonda Schneider  has presented today for surgery, with the diagnosis of RIGHT URETERAL STONE,BLADDER TUMOR  The various methods of treatment have been discussed with the patient and family. After consideration of risks, benefits and other options for treatment, the patient has consented to  Procedure(s): URETEROSCOPY WITH HOLMIUM LASER LITHOTRIPSY (Right) CYSTOSCOPY WITH STENT REPLACEMENT (Right) TRANSURETHRAL RESECTION OF BLADDER TUMOR (TURBT) (N/A) as a surgical intervention .  The patient's history has been reviewed, patient examined, no change in status, stable for surgery.  I have reviewed the patient's chart and labs.  Questions were answered to the patient's satisfaction.    RRR Unlabored resp  Nickie Retort

## 2015-11-13 NOTE — Transfer of Care (Signed)
Immediate Anesthesia Transfer of Care Note  Patient: Rhonda Schneider  Procedure(s) Performed: Procedure(s): URETEROSCOPY WITH HOLMIUM LASER LITHOTRIPSY (Right) CYSTOSCOPY WITH STENT REPLACEMENT (Right) TRANSURETHRAL RESECTION OF BLADDER TUMOR (TURBT) (N/A)  Patient Location: PACU  Anesthesia Type:General  Level of Consciousness: patient cooperative and lethargic  Airway & Oxygen Therapy: Patient Spontanous Breathing and Patient connected to face mask oxygen  Post-op Assessment: Report given to RN and Post -op Vital signs reviewed and stable  Post vital signs: Reviewed and stable  Last Vitals:  Filed Vitals:   11/13/15 0952 11/13/15 1218  BP: 126/73 139/81  Pulse: 89 79  Temp: 37.2 C 36.8 C  Resp: 16 14    Last Pain: There were no vitals filed for this visit.       Complications: No apparent anesthesia complications

## 2015-11-13 NOTE — H&P (View-Only) (Signed)
11/07/2015 9:21 AM   Rhonda Schneider [birth year] [id number]  Referring provider: No referring provider defined for this encounter.  No chief complaint on file.   HPI: The patient is a 55 year old female was recently admitted with a septic right ureteral stone. At that time a right ureteral stent was placed. Her urine culture grew Proteus sensitive to Ancef. 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   PMH: Past Medical History  Diagnosis Date  . Hypertension     no medications    Surgical History: Past Surgical History  Procedure Laterality Date  . Tubal ligation    . Appendectomy  1978    ruptured  . Cesarean section  1987  . Cystoscopy with stent placement Right 10/31/2015    Procedure: CYSTOSCOPY WITH STENT PLACEMENT;  Surgeon: Nickie Retort, MD;  Location: [facility] ORS;  Service: Urology;  Laterality: Right;    Home Medications:    Medication List       This list is accurate as of: 11/07/15  9:21 AM.  Always use your most recent med list.               ciprofloxacin 500 MG tablet  Commonly known as:  CIPRO  Take 1 tablet (500 mg total) by mouth 2 (two) times daily. For 11 days.     ketorolac 10 MG tablet  Commonly known as:  TORADOL  Take 1 tablet (10 mg total) by mouth every 6 (six) hours as needed.     oxyCODONE-acetaminophen 5-325 MG tablet  Commonly known as:  ROXICET  Take 1 tablet by mouth every 6 (six) hours as needed for severe pain.     simvastatin 20 MG tablet  Commonly known as:  ZOCOR  Take 20 mg by mouth at bedtime.     tamsulosin 0.4 MG Caps capsule  Commonly known as:  FLOMAX  Take 1 capsule (0.4 mg total) by mouth daily.        Allergies: No Known Allergies  Family History: No family history on file.  Social History:  reports that she has never smoked. She does not have any smokeless tobacco history on file. She reports that she does not drink alcohol. Her drug history is not on  file.  ROS:                                        Physical Exam: There were no vitals taken for this visit.  Constitutional:  Alert and oriented, No acute distress. HEENT: [place] AT, moist mucus membranes.  Trachea midline, no masses. Cardiovascular: No clubbing, cyanosis, or edema. Respiratory: Normal respiratory effort, no increased work of breathing. GI: Abdomen is soft, nontender, nondistended, no abdominal masses GU: No CVA tenderness.  Skin: No rashes, bruises or suspicious lesions. Lymph: No cervical or inguinal adenopathy. Neurologic: Grossly intact, no focal deficits, moving all 4 extremities. Psychiatric: Normal mood and affect.  Laboratory Data: Lab Results  Component Value Date   WBC 11.9* 11/01/2015   HGB 12.7 11/01/2015   HCT 36.5 11/01/2015   MCV 87.7 11/01/2015   PLT 107* 11/01/2015    Lab Results  Component Value Date   CREATININE 1.00 11/01/2015    No results found for: PSA  No results found for: TESTOSTERONE  No results found for: HGBA1C  Urinalysis    Component Value Date/Time  COLORURINE YELLOW* 10/31/2015 1104   APPEARANCEUR HAZY* 10/31/2015 1104   LABSPEC 1.008 10/31/2015 1104   PHURINE 7.0 10/31/2015 1104   GLUCOSEU NEGATIVE 10/31/2015 1104   HGBUR 2+* 10/31/2015 [place] 10/31/2015 [place] 10/31/2015 1104   PROTEINUR 30* 10/31/2015 1104   NITRITE NEGATIVE 10/31/2015 1104   LEUKOCYTESUR 3+* 10/31/2015 1104      Assessment & Plan:    1. Right ureteral stone 2. Bladder tumor I had a long discussion with the patient regarding definitive stone management for her right ureteral stone. Discussed cystoscopy, ureteroscopy, laser lithotripsy, and right ureteral stent exchange. She also has an incidental finding of a small tumor in her bladder which she will need transurethral resection of bladder tumor at the time of above. All risks, benefits, indications of these procedures were  discussed the patient. All questions were answered. She understands the risks including but limited to bleeding, infection, bladder injury, need for Foley catheter. All questions were answered. She has elected to proceed.     Nickie Retort, MD  [facility] [address], [place], [place] [address] [phone]

## 2015-11-13 NOTE — Op Note (Signed)
Date of procedure: 11/13/2015  Preoperative diagnosis:  1. Right ureteral calculus 2. Bladder tumor   Postoperative diagnosis:  1. Right ureteral calculus 2. Bladder tumor   Procedure: 1. Cystoscopy 2. Right ureteroscopy 3. Laser lithotripsy 4. Stone basketing 5. Right retrograde pyelogram with interpretation 6. Right ureteral stent exchange 6 [place] by 26 cm 7. Transurethral resection of bladder tumor 1 cm  Surgeon: Baruch Gouty, MD  Anesthesia: General  Complications: None  Intraoperative findingsthe patient had a right ureteral stone was broken into small pieces and remove the stone basket. Just below 1 cm bladder tumor lateral to the right ureteral orifice that was resected.  EBL: None  Specimens: Right ureteral stone, bladder tumor  Drains: 6 French by 26 cm double-J right ureteral stent  Disposition: Stable to the postanesthesia care unit  Indication for procedure: The patient is a 55 y.o. female with a 1 cm bladder tumor right ureteral stone status post stent during a septic episode. The bladder tumor was insulin found when her stent was placed. She presents today for stone removal as well as resection of bladder tumor..  After reviewing the management options for treatment, the patient elected to proceed with the above surgical procedure(s). We have discussed the potential benefits and risks of the procedure, side effects of the proposed treatment, the likelihood of the patient achieving the goals of the procedure, and any potential problems that might occur during the procedure or recuperation. Informed consent has been obtained.  Description of procedure: The patient was met in the preoperative area. All risks, benefits, and indications of the procedure were described in great detail. The patient consented to the procedure. Preoperative antibiotics were given. The patient was taken to the operative theater. General anesthesia was induced per the anesthesia service.  The patient was then placed in the dorsal lithotomy position and prepped and draped in the usual sterile fashion. A preoperative timeout was called.    76 French 30 cystoscope was inserted into the patient's bladder per urethra directly. The stent was grasped from the right ureteral orifice with flexor graspers. It is brought to level urethral meatus. A sensor wire was exchanged through L2 level of the renal pelvis and the fluoroscopy. A right semirigid ureteroscopy then took place. The stone was noted in the proximal ureter. It was broken into small fragments. The largest fragment was then removed with stone basket. Laser lithotripsy had broken up all the remaining fragments into dust. Pan ureteroscopy revealed no more stones. A right retrograde pyelogram was then obtained to ensure correct stent placement. There were no filling defects. There is was withdrawn showing no stone from its. A 6 French by 26 over double-J ureteral stent was then placed over the sensor wire with the aid of the cystoscope. A sensor wire was removed. It was confirmed to be in the correct location of the curls in the patient's renal pelvis with fluoroscopy patient's urinary bladder which are catheterization.  A rigid cystoscope was then inserted with the obturator to place a sheath. Then one segment tumor that was lateral to the right ureteral orifice was resected down to muscle. The chips of bladder were evacuated and sent to pathology. Hemostasis was then obtained and was excellent. The patient's bladder was then drained. She was awoken from anesthesia and transferred in stable condition to postanesthesia care unit.  Plan: The patient will follow-up in one week for stent removal in the office as well as go over pathology results.  Baruch Gouty, M.D.

## 2015-11-14 ENCOUNTER — Telehealth: Payer: Self-pay

## 2015-11-14 NOTE — Telephone Encounter (Signed)
Appointment has been made  Rhonda Schneider

## 2015-11-14 NOTE — Anesthesia Postprocedure Evaluation (Signed)
Anesthesia Post Note  Patient: Rhonda Schneider  Procedure(s) Performed: Procedure(s) (LRB): URETEROSCOPY WITH HOLMIUM LASER LITHOTRIPSY (Right) CYSTOSCOPY WITH STENT REPLACEMENT (Right) TRANSURETHRAL RESECTION OF BLADDER TUMOR (TURBT) (N/A)  Patient location during evaluation: PACU Anesthesia Type: General Level of consciousness: awake and alert Pain management: pain level controlled Vital Signs Assessment: post-procedure vital signs reviewed and stable Respiratory status: spontaneous breathing, nonlabored ventilation, respiratory function stable and patient connected to nasal cannula oxygen Cardiovascular status: blood pressure returned to baseline and stable Postop Assessment: no signs of nausea or vomiting Anesthetic complications: no    Last Vitals:  Filed Vitals:   11/13/15 1410 11/13/15 1411  BP:  126/73  Pulse: 78   Temp:    Resp: 16     Last Pain:  Filed Vitals:   11/13/15 1411  PainSc: 0-No pain                 Martha Clan

## 2015-11-14 NOTE — Telephone Encounter (Signed)
-----   Message from Nickie Retort, MD sent at 11/13/2015 12:19 PM EDT ----- Patient needs to see me next week for cysto/stent removal and pathology results. Please schedule just before lunch if need to double book

## 2015-11-15 LAB — SURGICAL PATHOLOGY

## 2015-11-21 ENCOUNTER — Ambulatory Visit (INDEPENDENT_AMBULATORY_CARE_PROVIDER_SITE_OTHER): Payer: 59 | Admitting: Urology

## 2015-11-21 VITALS — BP 122/83 | HR 91 | Ht 65.0 in | Wt 226.9 lb

## 2015-11-21 DIAGNOSIS — C670 Malignant neoplasm of trigone of bladder: Secondary | ICD-10-CM

## 2015-11-21 DIAGNOSIS — N200 Calculus of kidney: Secondary | ICD-10-CM

## 2015-11-21 LAB — URINALYSIS, COMPLETE
Bilirubin, UA: NEGATIVE
Glucose, UA: NEGATIVE
Ketones, UA: NEGATIVE
NITRITE UA: NEGATIVE
PH UA: 5.5 (ref 5.0–7.5)
Specific Gravity, UA: >1.03 — ABNORMAL HIGH (ref 1.005–1.030)
Urobilinogen, Ur: 0.2 mg/dL (ref 0.2–1.0)

## 2015-11-21 LAB — MICROSCOPIC EXAMINATION

## 2015-11-21 MED ORDER — LIDOCAINE HCL 2 % EX GEL
1.0000 "application " | Freq: Once | CUTANEOUS | Status: AC
  Administered 2015-11-21: 1 via URETHRAL

## 2015-11-21 MED ORDER — CIPROFLOXACIN HCL 500 MG PO TABS
500.0000 mg | ORAL_TABLET | Freq: Once | ORAL | Status: AC
  Administered 2015-11-21: 500 mg via ORAL

## 2015-11-21 NOTE — Progress Notes (Signed)
11/21/2015 12:11 PM   Rhonda Schneider 1960/07/20 [id number]  Referring provider: Boykin Nearing, MD [address] [facility] [place], [place] [address]  Chief Complaint  Patient presents with  . Cysto Stent Removal    HPI: The patient is a 55 year old female who presents after undergoing right ureteroscopy, laser lithotripsy, and TURBT. Next  1. Right ureteral stone Patient presents for stent removal today. Stone analysis pending.  2. Bladder tumor Inicdental finding during stone surgery. Low grade pTa < 3 cm (low risk)   PMH: Past Medical History:  Diagnosis Date  . Kidney stone     Surgical History: Past Surgical History:  Procedure Laterality Date  . APPENDECTOMY  1978   ruptured  . CESAREAN SECTION  1987  . CYSTOSCOPY W/ URETERAL STENT PLACEMENT Right 11/13/2015   Procedure: CYSTOSCOPY WITH STENT REPLACEMENT;  Surgeon: Nickie Retort, MD;  Location: [facility] ORS;  Service: Urology;  Laterality: Right;  . CYSTOSCOPY WITH STENT PLACEMENT Right 10/31/2015   Procedure: CYSTOSCOPY WITH STENT PLACEMENT;  Surgeon: Nickie Retort, MD;  Location: [facility] ORS;  Service: Urology;  Laterality: Right;  . TRANSURETHRAL RESECTION OF BLADDER TUMOR N/A 11/13/2015   Procedure: TRANSURETHRAL RESECTION OF BLADDER TUMOR (TURBT);  Surgeon: Nickie Retort, MD;  Location: [facility] ORS;  Service: Urology;  Laterality: N/A;  . TUBAL LIGATION    . URETEROSCOPY WITH HOLMIUM LASER LITHOTRIPSY Right 11/13/2015   Procedure: URETEROSCOPY WITH HOLMIUM LASER LITHOTRIPSY;  Surgeon: Nickie Retort, MD;  Location: [facility] ORS;  Service: Urology;  Laterality: Right;    Home Medications:    Medication List       Accurate as of 11/21/15 12:11 PM. Always use your most recent med list.          multivitamin tablet Take 1 tablet by mouth daily.   simvastatin 20 MG tablet Commonly known as:  ZOCOR Take 20 mg by mouth at bedtime.       Allergies: No Known  Allergies  Family History: Family History  Problem Relation Age of Onset  . Cancer Mother     breast    Social History:  reports that she has quit smoking. She does not have any smokeless tobacco history on file. She reports that she does not drink alcohol or use drugs.  ROS:                                        Physical Exam: BP 122/83   Pulse 91   Ht 5\' 5"  (1.651 m)   Wt 226 lb 14.4 oz (102.9 kg)   BMI 37.76 kg/m   Constitutional:  Alert and oriented, No acute distress. HEENT:  AT, moist mucus membranes.  Trachea midline, no masses. Cardiovascular: No clubbing, cyanosis, or edema. Respiratory: Normal respiratory effort, no increased work of breathing. GI: Abdomen is soft, nontender, nondistended, no abdominal masses GU: No CVA tenderness.  Skin: No rashes, bruises or suspicious lesions. Lymph: No cervical or inguinal adenopathy. Neurologic: Grossly intact, no focal deficits, moving all 4 extremities. Psychiatric: Normal mood and affect.  Laboratory Data: Lab Results  Component Value Date   WBC 11.9 (H) 11/01/2015   HGB 12.7 11/01/2015   HCT 36.5 11/01/2015   MCV 87.7 11/01/2015   PLT 107 (L) 11/01/2015    Lab Results  Component Value Date   CREATININE 1.00 11/01/2015    No results found  for: PSA  No results found for: TESTOSTERONE  No results found for: HGBA1C  Urinalysis    Component Value Date/Time   COLORURINE YELLOW (A) 10/31/2015 1104   APPEARANCEUR Cloudy (A) 11/07/2015 0918   LABSPEC 1.008 10/31/2015 1104   PHURINE 7.0 10/31/2015 1104   GLUCOSEU Negative 11/07/2015 0918   HGBUR 2+ (A) 10/31/2015 1104   BILIRUBINUR Negative 11/07/2015 0918   KETONESUR NEGATIVE 10/31/2015 1104   PROTEINUR 3+ (A) 11/07/2015 0918   PROTEINUR 30 (A) 10/31/2015 1104   NITRITE Negative 11/07/2015 0918   NITRITE NEGATIVE 10/31/2015 1104   LEUKOCYTESUR 1+ (A) 11/07/2015 0918     Cystoscopy Procedure Note  Patient identification was  confirmed, informed consent was obtained, and patient was prepped using Betadine solution.  Lidocaine jelly was administered per urethral meatus.    Preoperative abx where received prior to procedure.    Procedure: - Flexible cystoscope introduced, without any difficulty.   - Right stent removed with flexible graspers intact through urethra  - Ureteral orifices were normal in position and appearance.  Post-Procedure: - Patient tolerated the procedure well   Assessment & Plan:    1. Right ureteral stone -renal ultrasound to rule out iatrogenic hydronephrosis -will go over stone analysis when available  2. Low risk bladder cancer -cystoscopy in 3 months   Return in about 4 weeks (around 12/19/2015) for with renal u/s prior.  Nickie Retort, MD  [facility] [address], [place], [place] [address] [phone]

## 2015-11-22 ENCOUNTER — Encounter: Payer: Self-pay | Admitting: Radiology

## 2015-11-22 ENCOUNTER — Ambulatory Visit: Payer: Self-pay

## 2015-12-19 ENCOUNTER — Ambulatory Visit
Admission: RE | Admit: 2015-12-19 | Discharge: 2015-12-19 | Disposition: A | Discharge status: Home / Self Care | Payer: 59 | Source: Ambulatory Visit | Attending: Urology | Admitting: Urology

## 2015-12-19 DIAGNOSIS — Z87442 Personal history of urinary calculi: Secondary | ICD-10-CM | POA: Diagnosis not present

## 2015-12-19 DIAGNOSIS — R935 Abnormal findings on diagnostic imaging of other abdominal regions, including retroperitoneum: Secondary | ICD-10-CM | POA: Diagnosis not present

## 2015-12-19 DIAGNOSIS — N1330 Unspecified hydronephrosis: Secondary | ICD-10-CM | POA: Insufficient documentation

## 2015-12-19 DIAGNOSIS — N200 Calculus of kidney: Secondary | ICD-10-CM

## 2015-12-26 ENCOUNTER — Encounter: Payer: Self-pay | Admitting: Urology

## 2015-12-26 ENCOUNTER — Ambulatory Visit (INDEPENDENT_AMBULATORY_CARE_PROVIDER_SITE_OTHER): Payer: 59 | Admitting: Urology

## 2015-12-26 VITALS — BP 134/83 | HR 86 | Ht 65.0 in | Wt 228.4 lb

## 2015-12-26 DIAGNOSIS — N201 Calculus of ureter: Secondary | ICD-10-CM

## 2015-12-26 DIAGNOSIS — C679 Malignant neoplasm of bladder, unspecified: Secondary | ICD-10-CM

## 2015-12-26 NOTE — Progress Notes (Signed)
12/26/2015 2:21 PM   Rhonda Schneider March 20, 1961 [id number]  Referring provider: Boykin Nearing, MD [address] [facility] [place], [place] [address]  Chief Complaint  Patient presents with  . Nephrolithiasis    4 week follow up US completed    HPI: The patient is a 55 year old female with history of right ureteroscopy, laser lithotripsy, and TURBT who presents for follow up  1. Right ureteral stone Normal post instrumentation ultrasound. Stone analysis still pending.  2. Bladder tumor Inicdental finding during stone surgery. Low grade pTa < 3 cm (low risk). Resected July 2017. Due for cystoscopy in 2 months.      PMH: Past Medical History:  Diagnosis Date  . Kidney stone     Surgical History: Past Surgical History:  Procedure Laterality Date  . APPENDECTOMY  1978   ruptured  . CESAREAN SECTION  1987  . CYSTOSCOPY W/ URETERAL STENT PLACEMENT Right 11/13/2015   Procedure: CYSTOSCOPY WITH STENT REPLACEMENT;  Surgeon: Nickie Retort, MD;  Location: [facility] ORS;  Service: Urology;  Laterality: Right;  . CYSTOSCOPY WITH STENT PLACEMENT Right 10/31/2015   Procedure: CYSTOSCOPY WITH STENT PLACEMENT;  Surgeon: Nickie Retort, MD;  Location: [facility] ORS;  Service: Urology;  Laterality: Right;  . TRANSURETHRAL RESECTION OF BLADDER TUMOR N/A 11/13/2015   Procedure: TRANSURETHRAL RESECTION OF BLADDER TUMOR (TURBT);  Surgeon: Nickie Retort, MD;  Location: [facility] ORS;  Service: Urology;  Laterality: N/A;  . TUBAL LIGATION    . URETEROSCOPY WITH HOLMIUM LASER LITHOTRIPSY Right 11/13/2015   Procedure: URETEROSCOPY WITH HOLMIUM LASER LITHOTRIPSY;  Surgeon: Nickie Retort, MD;  Location: [facility] ORS;  Service: Urology;  Laterality: Right;    Home Medications:    Medication List       Accurate as of 12/26/15  2:21 PM. Always use your most recent med list.          multivitamin tablet Take 1 tablet by mouth daily.   simvastatin 20 MG  tablet Commonly known as:  ZOCOR Take 20 mg by mouth at bedtime.       Allergies: No Known Allergies  Family History: Family History  Problem Relation Age of Onset  . Cancer Mother     breast  . Kidney disease Neg Hx     Social History:  reports that she has quit smoking. She has never used smokeless tobacco. She reports that she does not drink alcohol or use drugs.  ROS: UROLOGY Frequent Urination?: No Hard to postpone urination?: No Burning/pain with urination?: No Get up at night to urinate?: No Leakage of urine?: No Urine stream starts and stops?: No Trouble starting stream?: No Do you have to strain to urinate?: No Blood in urine?: No Urinary tract infection?: No Sexually transmitted disease?: No Injury to kidneys or bladder?: No Painful intercourse?: No Weak stream?: No Currently pregnant?: No Vaginal bleeding?: No Last menstrual period?: n  Gastrointestinal Nausea?: No Vomiting?: No Indigestion/heartburn?: No Diarrhea?: No Constipation?: No  Constitutional Fever: No Night sweats?: No Weight loss?: No Fatigue?: No  Skin Skin rash/lesions?: No Itching?: No  Eyes Blurred vision?: No Double vision?: No  Ears/Nose/Throat Sore throat?: No Sinus problems?: No  Hematologic/Lymphatic Swollen glands?: No Easy bruising?: No  Cardiovascular Leg swelling?: No Chest pain?: No  Respiratory Cough?: No Shortness of breath?: No  Endocrine Excessive thirst?: No  Musculoskeletal Back pain?: No Joint pain?: No  Neurological Headaches?: No Dizziness?: No  Psychologic Depression?: No Anxiety?: No  Physical Exam: BP 134/83  Pulse 86   Ht 5\' 5"  (1.651 m)   Wt 228 lb 6.4 oz (103.6 kg)   BMI 38.01 kg/m   Constitutional:  Alert and oriented, No acute distress. HEENT: [place] AT, moist mucus membranes.  Trachea midline, no masses. Cardiovascular: No clubbing, cyanosis, or edema. Respiratory: Normal respiratory effort, no increased work of  breathing. GI: Abdomen is soft, nontender, nondistended, no abdominal masses GU: No CVA tenderness.  Skin: No rashes, bruises or suspicious lesions. Lymph: No cervical or inguinal adenopathy. Neurologic: Grossly intact, no focal deficits, moving all 4 extremities. Psychiatric: Normal mood and affect.  Laboratory Data: Lab Results  Component Value Date   WBC 11.9 (H) 11/01/2015   HGB 12.7 11/01/2015   HCT 36.5 11/01/2015   MCV 87.7 11/01/2015   PLT 107 (L) 11/01/2015    Lab Results  Component Value Date   CREATININE 1.00 11/01/2015    No results found for: PSA  No results found for: TESTOSTERONE  No results found for: HGBA1C  Urinalysis    Component Value Date/Time   COLORURINE YELLOW (A) 10/31/2015 1104   APPEARANCEUR Cloudy (A) 11/21/2015 1116   LABSPEC 1.008 10/31/2015 1104   PHURINE 7.0 10/31/2015 1104   GLUCOSEU Negative 11/21/2015 1116   HGBUR 2+ (A) 10/31/2015 1104   BILIRUBINUR Negative 11/21/2015 1116   KETONESUR NEGATIVE 10/31/2015 1104   PROTEINUR 3+ (A) 11/21/2015 1116   PROTEINUR 30 (A) 10/31/2015 1104   NITRITE Negative 11/21/2015 1116   NITRITE NEGATIVE 10/31/2015 1104   LEUKOCYTESUR 1+ (A) 11/21/2015 1116    Pertinent Imaging: Renal u/s reviewed. No hydro after voiding. Mild hydro likely reflux. Bilateral ureteral jets visualized.  Assessment & Plan:    1. Right ureteral stone -will go over stone analysis when available  2. Low risk bladder cancer -due for cystoscopy in 2 months  Return in about 2 months (around 02/25/2016) for cystoscopy.  Nickie Retort, MD  [facility] [address], [place], [place] [address] [phone]

## 2016-01-02 MED FILL — SIMVASTATIN 20 MG TABLET: 20 | 90 days supply | Qty: 90 | Fill #2

## 2016-01-28 ENCOUNTER — Other Ambulatory Visit (HOSPITAL_COMMUNITY): Payer: Self-pay | Admitting: Obstetrics and Gynecology

## 2016-01-28 DIAGNOSIS — Z1231 Encounter for screening mammogram for malignant neoplasm of breast: Secondary | ICD-10-CM

## 2016-02-03 ENCOUNTER — Ambulatory Visit (HOSPITAL_COMMUNITY)
Admission: RE | Admit: 2016-02-03 | Discharge: 2016-02-03 | Disposition: A | Discharge status: Home / Self Care | Payer: 59 | Source: Ambulatory Visit | Attending: Obstetrics and Gynecology | Admitting: Obstetrics and Gynecology

## 2016-02-03 DIAGNOSIS — Z1231 Encounter for screening mammogram for malignant neoplasm of breast: Secondary | ICD-10-CM | POA: Diagnosis not present

## 2016-02-27 ENCOUNTER — Ambulatory Visit: Payer: 59 | Admitting: Urology

## 2016-02-27 VITALS — BP 119/84 | HR 110 | Ht 66.0 in | Wt 227.0 lb

## 2016-02-27 DIAGNOSIS — N200 Calculus of kidney: Secondary | ICD-10-CM | POA: Diagnosis not present

## 2016-02-27 DIAGNOSIS — C679 Malignant neoplasm of bladder, unspecified: Secondary | ICD-10-CM | POA: Diagnosis not present

## 2016-02-27 LAB — URINALYSIS, COMPLETE
BILIRUBIN UA: NEGATIVE
Glucose, UA: NEGATIVE
KETONES UA: NEGATIVE
LEUKOCYTES UA: NEGATIVE
NITRITE UA: NEGATIVE
PH UA: 5.5 (ref 5.0–7.5)
Protein, UA: NEGATIVE
SPEC GRAV UA: 1.015 (ref 1.005–1.030)
UUROB: 0.2 mg/dL (ref 0.2–1.0)

## 2016-02-27 LAB — MICROSCOPIC EXAMINATION

## 2016-02-27 MED ORDER — CIPROFLOXACIN HCL 500 MG PO TABS
500.0000 mg | ORAL_TABLET | Freq: Once | ORAL | Status: AC
Start: 2016-02-27 — End: 2016-02-27
  Administered 2016-02-27: 500 mg via ORAL

## 2016-02-27 MED ORDER — LIDOCAINE HCL 2 % EX GEL
1.0000 "application " | Freq: Once | CUTANEOUS | Status: AC
  Administered 2016-02-27: 1 via URETHRAL

## 2016-02-27 NOTE — Progress Notes (Signed)
02/27/2016 2:30 PM   Rhonda Schneider [birth year] [id number]  Referring provider: Boykin Nearing, MD [address] [facility] [place], [place] [address]  Chief Complaint  Patient presents with  . Cysto    HPI: The patient is a 55 year old female  Presenting for cystosocpy  1. Bladder tumor Inicdentalfinding during stone surgery. Low grade pTa < 3 cm (low risk). Resected July 2017. First surveillance cystoscopy negative today.  2. History of nephrolithiasis No evidence of disease   PMH: Past Medical History:  Diagnosis Date  . Kidney stone     Surgical History: Past Surgical History:  Procedure Laterality Date  . APPENDECTOMY  1978   ruptured  . CESAREAN SECTION  1987  . CYSTOSCOPY W/ URETERAL STENT PLACEMENT Right 11/13/2015   Procedure: CYSTOSCOPY WITH STENT REPLACEMENT;  Surgeon: Nickie Retort, MD;  Location: [facility];  Service: Urology;  Laterality: Right;  . CYSTOSCOPY WITH STENT PLACEMENT Right 10/31/2015   Procedure: CYSTOSCOPY WITH STENT PLACEMENT;  Surgeon: Nickie Retort, MD;  Location: [facility];  Service: Urology;  Laterality: Right;  . TRANSURETHRAL RESECTION OF BLADDER TUMOR N/A 11/13/2015   Procedure: TRANSURETHRAL RESECTION OF BLADDER TUMOR (TURBT);  Surgeon: Nickie Retort, MD;  Location: [facility];  Service: Urology;  Laterality: N/A;  . TUBAL LIGATION    . URETEROSCOPY WITH HOLMIUM LASER LITHOTRIPSY Right 11/13/2015   Procedure: URETEROSCOPY WITH HOLMIUM LASER LITHOTRIPSY;  Surgeon: Nickie Retort, MD;  Location: [facility];  Service: Urology;  Laterality: Right;    Home Medications:    Medication List       Accurate as of 02/27/16  2:30 PM. Always use your most recent med list.          multivitamin tablet Take 1 tablet by mouth daily.   simvastatin 20 MG tablet Commonly known as:  ZOCOR Take 20 mg by mouth at bedtime.       Allergies: No Known Allergies  Family History: Family History    Problem Relation Age of Onset  . Cancer Mother     breast  . Kidney disease Neg Hx     Social History:  reports that she has quit smoking. She has never used smokeless tobacco. She reports that she does not drink alcohol or use drugs.  ROS:                                        Physical Exam: BP 119/84   Pulse (!) 110   Ht 5\' 6"  (1.676 m)   Wt 227 lb (103 kg)   BMI 36.64 kg/m   Constitutional:  Alert and oriented, No acute distress. HEENT: [place] AT, moist mucus membranes.  Trachea midline, no masses. Cardiovascular: No clubbing, cyanosis, or edema. Respiratory: Normal respiratory effort, no increased work of breathing. GI: Abdomen is soft, nontender, nondistended, no abdominal masses GU: No CVA tenderness.  Skin: No rashes, bruises or suspicious lesions. Lymph: No cervical or inguinal adenopathy. Neurologic: Grossly intact, no focal deficits, moving all 4 extremities. Psychiatric: Normal mood and affect.  Laboratory Data: Lab Results  Component Value Date   WBC 11.9 (H) 11/01/2015   HGB 12.7 11/01/2015   HCT 36.5 11/01/2015   MCV 87.7 11/01/2015   PLT 107 (L) 11/01/2015    Lab Results  Component Value Date   CREATININE 1.00 11/01/2015    No results found for: PSA  No results found for: TESTOSTERONE  No results found for: HGBA1C  Urinalysis    Component Value Date/Time   COLORURINE YELLOW (A) 10/31/2015 1104   APPEARANCEUR Cloudy (A) 11/21/2015 1116   LABSPEC 1.008 10/31/2015 1104   PHURINE 7.0 10/31/2015 1104   GLUCOSEU Negative 11/21/2015 1116   HGBUR 2+ (A) 10/31/2015 1104   BILIRUBINUR Negative 11/21/2015 1116   KETONESUR NEGATIVE 10/31/2015 1104   PROTEINUR 3+ (A) 11/21/2015 1116   PROTEINUR 30 (A) 10/31/2015 1104   NITRITE Negative 11/21/2015 1116   NITRITE NEGATIVE 10/31/2015 1104   LEUKOCYTESUR 1+ (A) 11/21/2015 1116    Cystoscopy Procedure Note  Patient identification was confirmed, informed consent was obtained,  and patient was prepped using Betadine solution.  Lidocaine jelly was administered per urethral meatus.    Preoperative abx where received prior to procedure.    Procedure: - Flexible cystoscope introduced, without any difficulty.   - Thorough search of the bladder revealed:    normal urethral meatus    normal urothelium    no stones    no ulcers     no tumors    no urethral polyps    no trabeculation  - Ureteral orifices were normal in position and appearance.  Post-Procedure: - Patient tolerated the procedure well   Assessment & Plan:    1. Low risk bladder cancer -due for cystoscopy in 6 months then annually if normal  2. History of nephrolithiasis No evidence of disease  Return in about 6 months (around 08/26/2016) for cystoscopy.  Nickie Retort, MD  [facility] [address] [place], [place] [address] [phone]

## 2016-04-02 MED FILL — SIMVASTATIN 20 MG TABLET: 20 | 90 days supply | Qty: 90 | Fill #3

## 2016-04-27 DIAGNOSIS — C801 Malignant (primary) neoplasm, unspecified: Secondary | ICD-10-CM

## 2016-04-27 HISTORY — DX: Malignant (primary) neoplasm, unspecified: C80.1

## 2016-06-29 MED FILL — SIMVASTATIN 20 MG TABLET: 20 | 90 days supply | Qty: 90 | Fill #0

## 2016-07-15 DIAGNOSIS — Z1211 Encounter for screening for malignant neoplasm of colon: Secondary | ICD-10-CM | POA: Diagnosis not present

## 2016-07-15 DIAGNOSIS — Z124 Encounter for screening for malignant neoplasm of cervix: Secondary | ICD-10-CM | POA: Diagnosis not present

## 2016-07-15 DIAGNOSIS — Z1231 Encounter for screening mammogram for malignant neoplasm of breast: Secondary | ICD-10-CM | POA: Diagnosis not present

## 2016-07-15 DIAGNOSIS — Z01419 Encounter for gynecological examination (general) (routine) without abnormal findings: Secondary | ICD-10-CM | POA: Diagnosis not present

## 2016-07-31 DIAGNOSIS — Z01419 Encounter for gynecological examination (general) (routine) without abnormal findings: Secondary | ICD-10-CM | POA: Diagnosis not present

## 2016-08-12 DIAGNOSIS — H52223 Regular astigmatism, bilateral: Secondary | ICD-10-CM | POA: Diagnosis not present

## 2016-08-12 DIAGNOSIS — H524 Presbyopia: Secondary | ICD-10-CM | POA: Diagnosis not present

## 2016-08-26 ENCOUNTER — Ambulatory Visit (INDEPENDENT_AMBULATORY_CARE_PROVIDER_SITE_OTHER): Payer: 59 | Admitting: Urology

## 2016-08-26 ENCOUNTER — Encounter: Payer: Self-pay | Admitting: Urology

## 2016-08-26 ENCOUNTER — Other Ambulatory Visit: Payer: 59

## 2016-08-26 VITALS — BP 112/75 | HR 98 | Ht 66.0 in | Wt 231.8 lb

## 2016-08-26 DIAGNOSIS — C679 Malignant neoplasm of bladder, unspecified: Secondary | ICD-10-CM | POA: Diagnosis not present

## 2016-08-26 LAB — URINALYSIS, COMPLETE
BILIRUBIN UA: NEGATIVE
GLUCOSE, UA: NEGATIVE
KETONES UA: NEGATIVE
NITRITE UA: NEGATIVE
Protein, UA: NEGATIVE
SPEC GRAV UA: 1.02 (ref 1.005–1.030)
Urobilinogen, Ur: 0.2 mg/dL (ref 0.2–1.0)
pH, UA: 5 (ref 5.0–7.5)

## 2016-08-26 LAB — MICROSCOPIC EXAMINATION: RBC, UA: NONE SEEN /hpf (ref 0–?)

## 2016-08-26 MED ORDER — LIDOCAINE HCL 2 % EX GEL
1.0000 "application " | Freq: Once | CUTANEOUS | Status: AC
  Administered 2016-08-26: 1 via URETHRAL

## 2016-08-26 MED ORDER — CIPROFLOXACIN HCL 500 MG PO TABS
500.0000 mg | ORAL_TABLET | Freq: Once | ORAL | Status: AC
  Administered 2016-08-26: 500 mg via ORAL

## 2016-08-26 NOTE — Progress Notes (Signed)
   08/26/16  CC:  Chief Complaint  Patient presents with  . Cysto    HPI: The patient is a 56 year old female  Presenting for cystosocpy  1. Bladder tumor Inicdentalfinding during stone surgery. Low grade pTa < 3 cm (low risk). Resected July 2017. Nine month statuspost resection cystoscopy negative today.  2. History of nephrolithiasis No evidence of disease  Blood pressure 112/75, pulse 98, height 5\' 6"  (1.676 m), weight 231 lb 12.8 oz (105.1 kg). NED. A&Ox3.   No respiratory distress   Abd soft, NT, ND Normal external genitalia with patent urethral meatus  Cystoscopy Procedure Note  Patient identification was confirmed, informed consent was obtained, and patient was prepped using Betadine solution.  Lidocaine jelly was administered per urethral meatus.    Preoperative abx where received prior to procedure.    Procedure: - Flexible cystoscope introduced, without any difficulty.   - Thorough search of the bladder revealed:    normal urethral meatus    normal urothelium    no stones    no ulcers     no tumors    no urethral polyps    no trabeculation  - Ureteral orifices were normal in position and appearance.  Post-Procedure: - Patient tolerated the procedure well  Assessment/ Plan:  1. Low risk bladder cancer -follow up in one year for continued surveillance cystoscopy  2. History of nephrolithiasis No evidence of disease  Nickie Retort, MD

## 2016-09-14 MED FILL — SIMVASTATIN 20 MG TABLET: 20 | 90 days supply | Qty: 90 | Fill #0 | Status: TO

## 2017-08-26 ENCOUNTER — Other Ambulatory Visit: Payer: 59

## 2017-09-02 ENCOUNTER — Other Ambulatory Visit: Payer: Self-pay | Admitting: Obstetrics and Gynecology

## 2017-09-02 DIAGNOSIS — Z1231 Encounter for screening mammogram for malignant neoplasm of breast: Secondary | ICD-10-CM

## 2017-09-23 ENCOUNTER — Ambulatory Visit
Admission: RE | Admit: 2017-09-23 | Discharge: 2017-09-23 | Disposition: A | Discharge status: Home / Self Care | Payer: BLUE CROSS/BLUE SHIELD | Source: Ambulatory Visit | Attending: Obstetrics and Gynecology | Admitting: Obstetrics and Gynecology

## 2017-09-23 DIAGNOSIS — Z1231 Encounter for screening mammogram for malignant neoplasm of breast: Secondary | ICD-10-CM | POA: Diagnosis not present

## 2017-09-23 HISTORY — DX: Malignant (primary) neoplasm, unspecified: C80.1

## 2017-09-27 ENCOUNTER — Other Ambulatory Visit: Payer: Self-pay | Admitting: Obstetrics and Gynecology

## 2017-09-27 DIAGNOSIS — R928 Other abnormal and inconclusive findings on diagnostic imaging of breast: Secondary | ICD-10-CM

## 2017-09-27 DIAGNOSIS — N6320 Unspecified lump in the left breast, unspecified quadrant: Secondary | ICD-10-CM

## 2017-09-29 ENCOUNTER — Ambulatory Visit
Admission: RE | Admit: 2017-09-29 | Discharge: 2017-09-29 | Disposition: A | Discharge status: Home / Self Care | Payer: BLUE CROSS/BLUE SHIELD | Source: Ambulatory Visit | Attending: Obstetrics and Gynecology | Admitting: Obstetrics and Gynecology

## 2017-09-29 DIAGNOSIS — N6320 Unspecified lump in the left breast, unspecified quadrant: Secondary | ICD-10-CM

## 2017-09-29 DIAGNOSIS — R928 Other abnormal and inconclusive findings on diagnostic imaging of breast: Secondary | ICD-10-CM

## 2017-09-30 ENCOUNTER — Other Ambulatory Visit: Payer: Self-pay | Admitting: Obstetrics and Gynecology

## 2017-09-30 DIAGNOSIS — R928 Other abnormal and inconclusive findings on diagnostic imaging of breast: Secondary | ICD-10-CM

## 2017-10-07 ENCOUNTER — Ambulatory Visit (INDEPENDENT_AMBULATORY_CARE_PROVIDER_SITE_OTHER): Payer: BLUE CROSS/BLUE SHIELD | Admitting: Urology

## 2017-10-07 ENCOUNTER — Encounter: Payer: Self-pay | Admitting: Urology

## 2017-10-07 VITALS — BP 128/82 | HR 89 | Ht 66.0 in | Wt 223.0 lb

## 2017-10-07 DIAGNOSIS — C679 Malignant neoplasm of bladder, unspecified: Secondary | ICD-10-CM

## 2017-10-07 DIAGNOSIS — Z8551 Personal history of malignant neoplasm of bladder: Secondary | ICD-10-CM

## 2017-10-07 LAB — MICROSCOPIC EXAMINATION

## 2017-10-07 LAB — URINALYSIS, COMPLETE
Bilirubin, UA: NEGATIVE
Glucose, UA: NEGATIVE
Ketones, UA: NEGATIVE
NITRITE UA: NEGATIVE
PH UA: 6 (ref 5.0–7.5)
PROTEIN UA: NEGATIVE
Specific Gravity, UA: 1.015 (ref 1.005–1.030)
Urobilinogen, Ur: 0.2 mg/dL (ref 0.2–1.0)

## 2017-10-07 MED ORDER — CIPROFLOXACIN HCL 500 MG PO TABS
500.0000 mg | ORAL_TABLET | Freq: Once | ORAL | Status: AC
  Administered 2017-10-07: 500 mg via ORAL

## 2017-10-07 MED ORDER — LIDOCAINE HCL URETHRAL/MUCOSAL 2 % EX GEL
1.0000 "application " | Freq: Once | CUTANEOUS | Status: AC
  Administered 2017-10-07: 1 via URETHRAL

## 2017-10-07 NOTE — Progress Notes (Signed)
   10/07/17  CC:  Chief Complaint  Patient presents with  . Cysto    HPI: 57 yo F with history of incidental Lg pTA <3 cm 10/2015 without recurrence who returns today for routine annual surveillance cytoscopy.    No voiding symptoms or gross hematuria.    Former smoker.   Blood pressure 128/82, pulse 89, height 5\' 6"  (1.676 m), weight 223 lb (101.2 kg). NED. A&Ox3.   No respiratory distress   Abd soft, NT, ND Normal external genitalia with patent urethral meatus  Cystoscopy Procedure Note  Patient identification was confirmed, informed consent was obtained, and patient was prepped using Betadine solution.  Lidocaine jelly was administered per urethral meatus.    Preoperative abx where received prior to procedure.    Procedure: - Flexible cystoscope introduced, without any difficulty.   - Thorough search of the bladder revealed:    normal urethral meatus    normal urothelium    no stones    no ulcers     no tumors    no urethral polyps    no trabeculation  Small <1 cm submucosal impression at dome of bladder, ? Cyst vs. Bowel.  - Ureteral orifices were normal in position and appearance.  Post-Procedure: - Patient tolerated the procedure well  Assessment/ Plan:  1. History of bladder cancer NED today Continue annual cystoscopy per guidelines Return sooner as needed - Urinalysis, Complete - ciprofloxacin (CIPRO) tablet 500 mg - lidocaine (XYLOCAINE) 2 % jelly 1 application  Return in about 1 year (around 10/08/2018) for cysto.  Hollice Espy, MD

## 2017-10-12 ENCOUNTER — Ambulatory Visit
Admission: RE | Admit: 2017-10-12 | Discharge: 2017-10-12 | Disposition: A | Discharge status: Home / Self Care | Payer: BLUE CROSS/BLUE SHIELD | Source: Ambulatory Visit | Attending: Obstetrics and Gynecology | Admitting: Obstetrics and Gynecology

## 2017-10-12 DIAGNOSIS — R928 Other abnormal and inconclusive findings on diagnostic imaging of breast: Secondary | ICD-10-CM

## 2017-10-12 HISTORY — PX: BREAST BIOPSY: SHX20

## 2017-10-14 ENCOUNTER — Other Ambulatory Visit: Payer: Self-pay | Admitting: Obstetrics and Gynecology

## 2017-10-15 LAB — SURGICAL PATHOLOGY

## 2017-10-18 ENCOUNTER — Encounter: Payer: Self-pay | Admitting: *Deleted

## 2017-10-18 NOTE — Progress Notes (Signed)
Oncology Nurse Navigator Documentation    Oncology Nurse Navigator Documentation  Navigator Location: [facility] (10/18/17 1400)   )Navigator Encounter Type: Introductory phone call (10/18/17 1400)   Abnormal Finding Date: 09/29/17 (10/18/17 1400) Confirmed Diagnosis Date: 10/14/17 (10/18/17 1400)                   Barriers/Navigation Needs: Coordination of Care (10/18/17 1400)                          Time Spent with Patient: 15 (10/18/17 1400)   Left message for patient to return my call.  She is newly diagnosed with invasive right breast cancer.  I would like to establish navigation services and assist with coordination of care.  Patient will need medical oncology and surgical referrals.  Patient returned my call.  States the breast called and scheduled her to see Dr. Peyton Najjar on Friday.  [place]  Reviewed navigation services.  States she does not know any of the oncologist and an appointment with anyone of them will be fine.  Informed her I would call her back tomorrow with an oncology appointment for next week since she is currently out of town.  She is agreeable.  She is to call if she has any questions or needs.

## 2017-10-19 ENCOUNTER — Other Ambulatory Visit: Payer: Self-pay | Admitting: *Deleted

## 2017-10-19 ENCOUNTER — Encounter: Payer: Self-pay | Admitting: *Deleted

## 2017-10-19 NOTE — Progress Notes (Signed)
Oncology Nurse Navigator Documentation    Oncology Nurse Navigator Documentation  Navigator Location: [facility] (10/19/17 0900) Referral date to RadOnc/MedOnc: 10/26/17 (10/19/17 0900) )Navigator Encounter Type: Telephone (10/19/17 0900) Telephone: Lahoma Crocker Call (10/19/17 0900)                       Barriers/Navigation Needs: Education;Coordination of Care (10/19/17 0900) Education: Newly Diagnosed Cancer Education (10/19/17 0900) Interventions: Coordination of Care (10/19/17 0900)   Coordination of Care: Appts (10/19/17 0900)                  Time Spent with Patient: 45 (10/19/17 0900)   Called patient back today with appointment to see Dr. Janese Banks on 10/26/17 @ 11:00.  She is to bring a photo ID and all her meds.  She is going to pick up her educational literature on Friday at Dr. Deniece Ree office.  She is to call if she has any questions or needs.

## 2017-10-22 ENCOUNTER — Ambulatory Visit: Payer: Self-pay | Admitting: General Surgery

## 2017-10-22 NOTE — H&P (Signed)
PATIENT PROFILE: Rhonda Schneider is a 57 y.o. female who presents to the Clinic for consultation at the request of Dr. Ouida Sills for evaluation of breast cancer.  PCP:  Schermerhorn, Burman Blacksmith, MD  HISTORY OF PRESENT ILLNESS: Rhonda Schneider reports she had her screening mammogram on 09/23/17 and was found with a suspicious lesion of the right breast. The lead to a diagnostic mammogram done on 09/29/17 which confirmed the suspicion lesion of the upper outer quadrant of the right breast, which was not identified on ultrasound. 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   Patient denies any pain of the breasts, skin changes or nipple discharge.   Family history of breast cancer: Mother (64's) Family history of other cancers: none Menarche: 57 y.o Menopause: 57 y/o Used OCP: yes for 8 years Used estrogen and progesterone therapy: none  History of Radiation to the chest: none No previous breast biopsy  PROBLEM LIST:   Obesity Right breast cancer  GENERAL REVIEW OF SYSTEMS:   General ROS: negative for - chills, fatigue, fever, weight gain or weight loss Allergy and Immunology ROS: negative for - hives  Hematological and Lymphatic ROS: negative for - bleeding problems or bruising, negative for palpable nodes Endocrine ROS: negative for - heat or cold intolerance, hair changes Respiratory ROS: negative for - cough, shortness of breath or wheezing Cardiovascular ROS: no chest pain or palpitations GI ROS: negative for nausea, vomiting, abdominal pain, diarrhea, constipation Musculoskeletal ROS: negative for - joint swelling or muscle pain Neurological ROS: negative for - confusion, syncope Dermatological ROS: negative for pruritus and rash Psychiatric: negative for anxiety, depression, difficulty sleeping and memory loss  MEDICATIONS: CurrentMedications         Current Outpatient Medications  Medication Sig Dispense Refill  . phentermine (ADIPEX-P) 37.5 mg tablet Take 1 tablet (37.5 mg total) by mouth every morning before breakfast 30 tablet 0  . simvastatin (ZOCOR) 20 MG tablet TAKE 1 TABLET BY MOUTH EVERY DAY IN THE EVENING 90 tablet 3   No current facility-administered medications for this visit.       ALLERGIES: Patient has no known allergies.  PAST MEDICAL HISTORY:     Past Medical History:  Diagnosis Date  . Breast cancer , unspecified (CMS-HCC) 2019  . Cancer (CMS-HCC)    Bladder  . Hyperlipidemia     PAST SURGICAL HISTORY:      Past Surgical History:  Procedure Laterality Date  . CESAREAN SECTION    . TUBAL LIGATION       FAMILY HISTORY:      Family History  Problem Relation Age of Onset  . Breast cancer Mother 59     SOCIAL HISTORY: Social History          Socioeconomic History  . Marital status: Married    Spouse name: Not on file  . Number of children: Not on file  . Years of education: Not on file  . Highest education level: Not on file  Occupational History  . Not on file  Social Needs  . Financial resource strain: Not on file  . Food insecurity:    Worry: Not on file    Inability: Not on file  . Transportation needs:    Medical: Not on file    Non-medical: Not on file  Tobacco Use  . Smoking status: Never Smoker  . Smokeless tobacco: Never Used  Substance and Sexual  Activity  . Alcohol use: No  . Drug use: Not on file  . Sexual activity: Not on file  Other Topics Concern  . Not on file  Social History Narrative  . Not on file      PHYSICAL EXAM:    Vitals:   10/22/17 1112  BP: 132/86  Pulse: 100  Temp: 36.1 C (96.9 F)   Body mass index is 35.99 kg/m. Weight: (!) 101.2 kg (223 lb)   GENERAL: Alert, active, oriented x3  HEENT: Pupils equal reactive to light. Extraocular movements are intact. Sclera clear. Palpebral conjunctiva normal red  color.Pharynx clear.  NECK: Supple with no palpable mass and no adenopathy.  LUNGS: Sound clear with no rales rhonchi or wheezes.  HEART: Regular rhythm S1 and S2 without murmur.  BREAST: breasts appear normal, no suspicious masses, no skin or nipple changes or axillary nodes. There is just a small bruise on the lateral aspect of the right breast from the biopsy.   ABDOMEN: Soft and depressible, nontender with no palpable mass, no hepatomegaly.  EXTREMITIES: Well-developed well-nourished symmetrical with no dependent edema.  NEUROLOGICAL: Awake alert oriented, facial expression symmetrical, moving all extremities.  REVIEW OF DATA: I have reviewed the following data today:      Appointment on 09/03/2017  Component Date Value  . Glucose 09/03/2017 100   . Sodium 09/03/2017 144   . Potassium 09/03/2017 4.2   . Chloride 09/03/2017 107   . Carbon Dioxide (CO2) 09/03/2017 30.7   . Urea Nitrogen (BUN) 09/03/2017 15   . Creatinine 09/03/2017 0.9   . Glomerular Filtration Ra* 09/03/2017 65   . Calcium 09/03/2017 9.7   . AST  09/03/2017 14   . ALT  09/03/2017 18   . Alk Phos (alkaline Phosp* 09/03/2017 80   . Albumin 09/03/2017 4.4   . Bilirubin, Total 09/03/2017 0.5   . Protein, Total 09/03/2017 6.5   . A/G Ratio 09/03/2017 2.1   . WBC (White Blood Cell Co* 09/03/2017 4.7   . RBC (Red Blood Cell Coun* 09/03/2017 4.63   . Hemoglobin 09/03/2017 13.9   . Hematocrit 09/03/2017 41.8   . MCV (Mean Corpuscular Vo* 09/03/2017 90.3   . MCH (Mean Corpuscular He* 09/03/2017 30.0   . MCHC (Mean Corpuscular H* 09/03/2017 33.3   . Platelet Count 09/03/2017 200   . RDW-CV (Red Cell Distrib* 09/03/2017 12.0   . MPV (Mean Platelet Volum* 09/03/2017 10.3   . Neutrophils 09/03/2017 2.81   . Lymphocytes 09/03/2017 1.42   . Monocytes 09/03/2017 0.40   . Eosinophils 09/03/2017 0.06   . Basophils 09/03/2017 0.02   . Neutrophil % 09/03/2017 59.5   . Lymphocyte % 09/03/2017 30.1   .  Monocyte % 09/03/2017 8.5   . Eosinophil % 09/03/2017 1.3   . Basophil% 09/03/2017 0.4   . Immature Granulocyte % 09/03/2017 0.2   . Immature Granulocyte Cou* 09/03/2017 0.01   . Cholesterol, Total 09/03/2017 166   . Triglyceride 09/03/2017 208*  . HDL (High Density Lipopr* 09/03/2017 40.9   . LDL (Low Density Lipopro* 09/03/2017 84   . VLDL Cholesterol 09/03/2017 42   . Cholesterol/HDL Ratio 09/03/2017 4.1   . Thyroid Stimulating Horm* 09/03/2017 2.724     Surgical Pathology * THIS IS AN ADDENDUM REPORT * CASE: [id number] PATIENT: Rhonda Schneider Surgical Pathology Report  SPECIMEN SUBMITTED: A. Breast, right  CLINICAL HISTORY: Right breast possible distortion  PRE-OPERATIVE DIAGNOSIS: CSL vs malignancy  POST-OPERATIVE DIAGNOSIS: None provided.  DIAGNOSIS: A. BREAST, RIGHT  UPPER OUTER; STEREOTACTIC BIOPSY: - SMALL FOCUS OF LOW-GRADE INVASIVE CARCINOMA. - DUCTAL CARCINOMA IN SITU, LOW NUCLEAR GRADE, MICROPAPILLARY TYPE. - CALCIFICATIONS ASSOCIATED WITH INVASIVE CARCINOMA, DCIS AND COLUMNAR CELL CHANGE.  Size of invasive carcinoma:1.7 mm in this sample Histologic grade of invasive carcinoma: Grade 1  Glandular/tubular differentiation score: 1  Nuclear pleomorphism score: 2  Mitotic rate score: 1  Total score: 4 Ductal carcinoma in situ: Present Lymphovascular invasion: Not identified  ER/PR/HER2: Immunohistochemistry will be performed on block A1, with reflex to Verdi for HER2 2+. The results will be reported in an addendum.  Comment: The definitive grade will be assigned on the excisional specimen. There was a delay in the sign-out of this case so that p63 staining could be performed. The p63 stain highlights a lack of myoepithelial cells in the area of the well-differentiated carcinoma. These findings were communicated to [facility] in Dr. Johnston Ebbs office on 10/14/2017. Read back  procedure was performed.  IHC slides were prepared by Launa Grill, [place]. All controls stained appropriately.  This test was developed and its performance characteristics determined by [facility]. It has not been cleared or approved by the [place] Food and Drug Administration. The FDA does not require this test to go through premarket FDA review. This test is used for clinical purposes. It should not be regarded as investigational or for research. This laboratory is certified under the Clinical Laboratory Improvement Amendments (CLIA) as qualified to perform high complexity clinical laboratory testing.   GROSS DESCRIPTION: A. Labeled: Right breast upper outer distortion Received: in a formalin-filled [place] collection device Accompanying specimen radiograph: No Time/Date in fixative: 8:40 AM on 10/12/2017 Cold ischemic time: 3 minutes Total fixation time: 8.5 hours Core pieces: Multiple Measurement: Aggregate, 4.0 x 2.2 x 0.2 cm Description / comments: Yellow lobulated fibrofatty Inked: Black Entirely submitted in cassette(s): 1-3  Final Diagnosis performed by Quay Burow, MD. Electronically signed 10/14/2017 10:47:13AM The electronic signature indicates that the named Attending Pathologist has evaluated the specimen  Technical component performed at [facility], [address] [place], [place] [address] Lab: [phone] Dir: Rush Farmer, MD, MMMProfessional component performed at [facility], [facility], [place], [place], [place] [address] Lab: [phone] Dir: Dellia Nims. Rubinas, MD  ADDENDUM: BREAST BIOMARKER TESTS Estrogen Receptor (ER) Status: POSITIVE Percentage of cells with nuclear positivity: >90% Average intensity of staining: Strong  Progesterone Receptor (PgR) Status: POSITIVE Percentage of cells with nuclear positivity: 51-90% Average intensity of staining: Moderate  HER2 (by immunohistochemistry): NEGATIVE (1+)  Cold Ischemia and  Fixation Times: Meet requirements specified in latest version of the ASCO/CAP guidelines Testing Performed on Block Number(s): A1  METHODS Fixative: Formalin Estrogen Receptor:FDA cleared ([place]) Primary Antibody:SP1 Progesterone Receptor: FDA cleared ([place]) Primary Antibody: 1E2 HER2 (by IHC): FDA approved ([place]) Primary Antibody: HercepTest Immunohistochemistry controls worked appropriately. Slides were prepared by [facility], [place], [place], and interpreted by Quay Burow, MD.  This test was developed and its performance characteristics determined by [facility]. It has not been cleared or approved by the [place] Food and Drug Administration. The FDA does not require this test to go through premarket FDA review. This test is used for clinical purposes. It should not be regarded as investigational or for research. This laboratory is certified under the Clinical Laboratory Improvement Amendments (CLIA) as qualified to perform high complexity clinical laboratory testing.  ASSESSMENT: Rhonda Schneider is a 57 y.o. female presenting for consultation for right breast cancer.    Patient was oriented again about  the pathology results. Surgical alternatives were discussed with patient including partial vs total mastectomy. Surgical technique and post operative care was discussed with patient. Risk of surgery was discussed with patient including but not limited to: wound infection, seroma, hematoma, brachial plexopathy, mondor's disease (thrombosis of small veins of breast), chronic wound pain, breast lymphedema, altered sensation to the nipple and cosmesis among others.   Patient had agreed to proceed with partial mastectomy (needle guided) with sentinel lymph node biopsy. Patient oriented about the possibility of post op radiation and anti estrogen pill but that will be discussed with the Oncologist.   PLAN: 1. Right breast needle guided partial mastectomy  ([id number]) with right axillary sentinel lymph node biopsy 2. CBC, CMP done  3. Seen Oncologist on Tuesday 10/26/17 4. Contact us if has any question or concern  Patient and her husband verbalized understanding, all questions were answered, and were agreeable with the plan outlined above.   Herbert Pun, MD  Electronically signed by Herbert Pun, MD

## 2017-10-22 NOTE — H&P (View-Only) (Signed)
PATIENT PROFILE: Rhonda Schneider is a 57 y.o. female who presents to the Clinic for consultation at the request of Dr. Ouida Schneider for evaluation of breast cancer.  PCP:  Schermerhorn, Rhonda Blacksmith, MD  HISTORY OF PRESENT ILLNESS: Rhonda Schneider reports she had her screening mammogram on 09/23/17 and was found with a suspicious lesion of the right breast. The lead to a diagnostic mammogram done on 09/29/17 which confirmed the suspicion lesion of the upper outer quadrant of the right breast, which was not identified on ultrasound. 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   Patient denies any pain of the breasts, skin changes or nipple discharge.   Family history of breast cancer: Mother (64's) Family history of other cancers: none Menarche: 57 y.o Menopause: 57 y/o Used OCP: yes for 8 years Used estrogen and progesterone therapy: none  History of Radiation to the chest: none No previous breast biopsy  PROBLEM LIST:   Obesity Right breast cancer  GENERAL REVIEW OF SYSTEMS:   General ROS: negative for - chills, fatigue, fever, weight gain or weight loss Allergy and Immunology ROS: negative for - hives  Hematological and Lymphatic ROS: negative for - bleeding problems or bruising, negative for palpable nodes Endocrine ROS: negative for - heat or cold intolerance, hair changes Respiratory ROS: negative for - cough, shortness of breath or wheezing Cardiovascular ROS: no chest pain or palpitations GI ROS: negative for nausea, vomiting, abdominal pain, diarrhea, constipation Musculoskeletal ROS: negative for - joint swelling or muscle pain Neurological ROS: negative for - confusion, syncope Dermatological ROS: negative for pruritus and rash Psychiatric: negative for anxiety, depression, difficulty sleeping and memory loss  MEDICATIONS: CurrentMedications         Current Outpatient Medications  Medication Sig Dispense Refill  . phentermine (ADIPEX-P) 37.5 mg tablet Take 1 tablet (37.5 mg total) by mouth every morning before breakfast 30 tablet 0  . simvastatin (ZOCOR) 20 MG tablet TAKE 1 TABLET BY MOUTH EVERY DAY IN THE EVENING 90 tablet 3   No current facility-administered medications for this visit.       ALLERGIES: Patient has no known allergies.  PAST MEDICAL HISTORY:     Past Medical History:  Diagnosis Date  . Breast cancer , unspecified (CMS-HCC) 2019  . Cancer (CMS-HCC)    Bladder  . Hyperlipidemia     PAST SURGICAL HISTORY:      Past Surgical History:  Procedure Laterality Date  . CESAREAN SECTION    . TUBAL LIGATION       FAMILY HISTORY:      Family History  Problem Relation Age of Onset  . Breast cancer Mother 59     SOCIAL HISTORY: Social History          Socioeconomic History  . Marital status: Married    Spouse name: Not on file  . Number of children: Not on file  . Years of education: Not on file  . Highest education level: Not on file  Occupational History  . Not on file  Social Needs  . Financial resource strain: Not on file  . Food insecurity:    Worry: Not on file    Inability: Not on file  . Transportation needs:    Medical: Not on file    Non-medical: Not on file  Tobacco Use  . Smoking status: Never Smoker  . Smokeless tobacco: Never Used  Substance and Sexual  Activity  . Alcohol use: No  . Drug use: Not on file  . Sexual activity: Not on file  Other Topics Concern  . Not on file  Social History Narrative  . Not on file      PHYSICAL EXAM:    Vitals:   10/22/17 1112  BP: 132/86  Pulse: 100  Temp: 36.1 C (96.9 F)   Body mass index is 35.99 kg/m. Weight: (!) 101.2 kg (223 lb)   GENERAL: Alert, active, oriented x3  HEENT: Pupils equal reactive to light. Extraocular movements are intact. Sclera clear. Palpebral conjunctiva normal red  color.Pharynx clear.  NECK: Supple with no palpable mass and no adenopathy.  LUNGS: Sound clear with no rales rhonchi or wheezes.  HEART: Regular rhythm S1 and S2 without murmur.  BREAST: breasts appear normal, no suspicious masses, no skin or nipple changes or axillary nodes. There is just a small bruise on the lateral aspect of the right breast from the biopsy.   ABDOMEN: Soft and depressible, nontender with no palpable mass, no hepatomegaly.  EXTREMITIES: Well-developed well-nourished symmetrical with no dependent edema.  NEUROLOGICAL: Awake alert oriented, facial expression symmetrical, moving all extremities.  REVIEW OF DATA: I have reviewed the following data today:      Appointment on 09/03/2017  Component Date Value  . Glucose 09/03/2017 100   . Sodium 09/03/2017 144   . Potassium 09/03/2017 4.2   . Chloride 09/03/2017 107   . Carbon Dioxide (CO2) 09/03/2017 30.7   . Urea Nitrogen (BUN) 09/03/2017 15   . Creatinine 09/03/2017 0.9   . Glomerular Filtration Ra* 09/03/2017 65   . Calcium 09/03/2017 9.7   . AST  09/03/2017 14   . ALT  09/03/2017 18   . Alk Phos (alkaline Phosp* 09/03/2017 80   . Albumin 09/03/2017 4.4   . Bilirubin, Total 09/03/2017 0.5   . Protein, Total 09/03/2017 6.5   . A/G Ratio 09/03/2017 2.1   . WBC (White Blood Cell Co* 09/03/2017 4.7   . RBC (Red Blood Cell Coun* 09/03/2017 4.63   . Hemoglobin 09/03/2017 13.9   . Hematocrit 09/03/2017 41.8   . MCV (Mean Corpuscular Vo* 09/03/2017 90.3   . MCH (Mean Corpuscular He* 09/03/2017 30.0   . MCHC (Mean Corpuscular H* 09/03/2017 33.3   . Platelet Count 09/03/2017 200   . RDW-CV (Red Cell Distrib* 09/03/2017 12.0   . MPV (Mean Platelet Volum* 09/03/2017 10.3   . Neutrophils 09/03/2017 2.81   . Lymphocytes 09/03/2017 1.42   . Monocytes 09/03/2017 0.40   . Eosinophils 09/03/2017 0.06   . Basophils 09/03/2017 0.02   . Neutrophil % 09/03/2017 59.5   . Lymphocyte % 09/03/2017 30.1   .  Monocyte % 09/03/2017 8.5   . Eosinophil % 09/03/2017 1.3   . Basophil% 09/03/2017 0.4   . Immature Granulocyte % 09/03/2017 0.2   . Immature Granulocyte Cou* 09/03/2017 0.01   . Cholesterol, Total 09/03/2017 166   . Triglyceride 09/03/2017 208*  . HDL (High Density Lipopr* 09/03/2017 40.9   . LDL (Low Density Lipopro* 09/03/2017 84   . VLDL Cholesterol 09/03/2017 42   . Cholesterol/HDL Ratio 09/03/2017 4.1   . Thyroid Stimulating Horm* 09/03/2017 2.724     Surgical Pathology * THIS IS AN ADDENDUM REPORT * CASE: [id number] PATIENT: Laketta Bahner Surgical Pathology Report  SPECIMEN SUBMITTED: A. Breast, right  CLINICAL HISTORY: Right breast possible distortion  PRE-OPERATIVE DIAGNOSIS: CSL vs malignancy  POST-OPERATIVE DIAGNOSIS: None provided.  DIAGNOSIS: A. BREAST, RIGHT  UPPER OUTER; STEREOTACTIC BIOPSY: - SMALL FOCUS OF LOW-GRADE INVASIVE CARCINOMA. - DUCTAL CARCINOMA IN SITU, LOW NUCLEAR GRADE, MICROPAPILLARY TYPE. - CALCIFICATIONS ASSOCIATED WITH INVASIVE CARCINOMA, DCIS AND COLUMNAR CELL CHANGE.  Size of invasive carcinoma:1.7 mm in this sample Histologic grade of invasive carcinoma: Grade 1  Glandular/tubular differentiation score: 1  Nuclear pleomorphism score: 2  Mitotic rate score: 1  Total score: 4 Ductal carcinoma in situ: Present Lymphovascular invasion: Not identified  ER/PR/HER2: Immunohistochemistry will be performed on block A1, with reflex to [place] for HER2 2+. The results will be reported in an addendum.  Comment: The definitive grade will be assigned on the excisional specimen. There was a delay in the sign-out of this case so that p63 staining could be performed. The p63 stain highlights a lack of myoepithelial cells in the area of the well-differentiated carcinoma. These findings were communicated to [facility] in Dr. Johnston Ebbs office on 10/14/2017. Read back  procedure was performed.  IHC slides were prepared by Launa Grill, [place]. All controls stained appropriately.  This test was developed and its performance characteristics determined by [facility]. It has not been cleared or approved by the [place] Food and Drug Administration. The FDA does not require this test to go through premarket FDA review. This test is used for clinical purposes. It should not be regarded as investigational or for research. This laboratory is certified under the Clinical Laboratory Improvement Amendments (CLIA) as qualified to perform high complexity clinical laboratory testing.   GROSS DESCRIPTION: A. Labeled: Right breast upper outer distortion Received: in a formalin-filled [place] collection device Accompanying specimen radiograph: No Time/Date in fixative: 8:40 AM on 10/12/2017 Cold ischemic time: 3 minutes Total fixation time: 8.5 hours Core pieces: Multiple Measurement: Aggregate, 4.0 x 2.2 x 0.2 cm Description / comments: Yellow lobulated fibrofatty Inked: Black Entirely submitted in cassette(s): 1-3  Final Diagnosis performed by Quay Burow, MD. Electronically signed 10/14/2017 10:47:13AM The electronic signature indicates that the named Attending Pathologist has evaluated the specimen  Technical component performed at [facility], [address], [place], [place] [address] Lab: [phone] Dir: Rush Farmer, MD, MMMProfessional component performed at [facility], [facility], [place], [place], [place] [address] Lab: [phone] Dir: Dellia Nims. Rubinas, MD  ADDENDUM: BREAST BIOMARKER TESTS Estrogen Receptor (ER) Status: POSITIVE Percentage of cells with nuclear positivity: >90% Average intensity of staining: Strong  Progesterone Receptor (PgR) Status: POSITIVE Percentage of cells with nuclear positivity: 51-90% Average intensity of staining: Moderate  HER2 (by immunohistochemistry): NEGATIVE (1+)  Cold Ischemia and  Fixation Times: Meet requirements specified in latest version of the ASCO/CAP guidelines Testing Performed on Block Number(s): A1  METHODS Fixative: Formalin Estrogen Receptor:FDA cleared ([place]) Primary Antibody:SP1 Progesterone Receptor: FDA cleared ([place]) Primary Antibody: 1E2 HER2 (by IHC): FDA approved ([place]) Primary Antibody: HercepTest Immunohistochemistry controls worked appropriately. Slides were prepared by [facility], [place], [place], and interpreted by Quay Burow, MD.  This test was developed and its performance characteristics determined by [facility]. It has not been cleared or approved by the [place] Food and Drug Administration. The FDA does not require this test to go through premarket FDA review. This test is used for clinical purposes. It should not be regarded as investigational or for research. This laboratory is certified under the Clinical Laboratory Improvement Amendments (CLIA) as qualified to perform high complexity clinical laboratory testing.  ASSESSMENT: Ms. Trevizo is a 57 y.o. female presenting for consultation for right breast cancer.    Patient was oriented again about  the pathology results. Surgical alternatives were discussed with patient including partial vs total mastectomy. Surgical technique and post operative care was discussed with patient. Risk of surgery was discussed with patient including but not limited to: wound infection, seroma, hematoma, brachial plexopathy, mondor's disease (thrombosis of small veins of breast), chronic wound pain, breast lymphedema, altered sensation to the nipple and cosmesis among others.   Patient had agreed to proceed with partial mastectomy (needle guided) with sentinel lymph node biopsy. Patient oriented about the possibility of post op radiation and anti estrogen pill but that will be discussed with the Oncologist.   PLAN: 1. Right breast needle guided partial mastectomy  (68616) with right axillary sentinel lymph node biopsy 2. CBC, CMP done  3. Seen Oncologist on Tuesday 10/26/17 4. Contact us if has any question or concern  Patient and her husband verbalized understanding, all questions were answered, and were agreeable with the plan outlined above.   Herbert Pun, MD  Electronically signed by Herbert Pun, MD

## 2017-10-25 ENCOUNTER — Other Ambulatory Visit: Payer: Self-pay | Admitting: General Surgery

## 2017-10-25 DIAGNOSIS — C50411 Malignant neoplasm of upper-outer quadrant of right female breast: Secondary | ICD-10-CM

## 2017-10-25 DIAGNOSIS — Z170 Estrogen receptor positive status [ER+]: Principal | ICD-10-CM

## 2017-10-26 ENCOUNTER — Encounter: Payer: Self-pay | Admitting: *Deleted

## 2017-10-26 ENCOUNTER — Other Ambulatory Visit: Payer: Self-pay

## 2017-10-26 ENCOUNTER — Inpatient Hospital Stay: Payer: BLUE CROSS/BLUE SHIELD | Attending: Oncology | Admitting: Oncology

## 2017-10-26 ENCOUNTER — Encounter: Payer: Self-pay | Admitting: Oncology

## 2017-10-26 VITALS — BP 133/87 | HR 96 | Temp 97.2°F | Resp 18 | Ht 66.14 in | Wt 219.8 lb

## 2017-10-26 DIAGNOSIS — Z170 Estrogen receptor positive status [ER+]: Secondary | ICD-10-CM | POA: Diagnosis not present

## 2017-10-26 DIAGNOSIS — Z87891 Personal history of nicotine dependence: Secondary | ICD-10-CM | POA: Diagnosis not present

## 2017-10-26 DIAGNOSIS — C50911 Malignant neoplasm of unspecified site of right female breast: Secondary | ICD-10-CM | POA: Insufficient documentation

## 2017-10-26 DIAGNOSIS — Z7189 Other specified counseling: Secondary | ICD-10-CM | POA: Diagnosis not present

## 2017-10-26 NOTE — Progress Notes (Signed)
Here for new pt evaluation.  

## 2017-10-26 NOTE — Progress Notes (Signed)
Hematology/Oncology Consult note [facility] [phone] [phone]  Patient Care Team: Schermerhorn, Gwen Her, MD as PCP - General (Obstetrics and Gynecology)   Name of the patient: Rhonda Schneider  [id number]  [birth year]    Reason for referral- new diagnosis of breast cancer   Referring physician- Dr. Gilman Schmidt  Date of visit: 10/26/17   History of presenting illness-patient is a 57 year old female with no significant medical problems.  She recently had a screening bilateral mammogram which showed left breast cysts and architectural distortion in her right breast warranting a biopsy.  No evidence of abnormal axillary adenopathy noted on scans.  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  Size of invasive carcinoma was 1.7 mm, grade 1.  Lymphovascular invasion not identified.  ER PR positive and HER-2/neu negative.    Patient has seen Dr. Peyton Najjar and is scheduled to undergo lumpectomy and sentinel lymph node biopsy next week.  Patient's family history significant for her mother who had breast cancer in her 57s.  Patient herself has not had any prior abnormal mammograms or breast biopsies.  She is currently postmenopausal and has not had any menstrual cycles for about 3 years.  She used birth control remotely in the 52s.  Currently she is not on any hormone replacement therapy.  She is G2, P2 L2.  She reports doing well and denies any complaints today  ECOG PS- 0  Pain scale- 0   Review of systems- Review of Systems  Constitutional: Negative for chills, fever, malaise/fatigue and weight loss.  HENT: Negative for congestion, ear discharge and nosebleeds.   Eyes: Negative for blurred vision.  Respiratory: Negative for cough, hemoptysis, sputum production, shortness of breath and wheezing.   Cardiovascular: Negative for chest pain, palpitations, orthopnea and claudication.  Gastrointestinal: Negative  for abdominal pain, blood in stool, constipation, diarrhea, heartburn, melena, nausea and vomiting.  Genitourinary: Negative for dysuria, flank pain, frequency, hematuria and urgency.  Musculoskeletal: Negative for back pain, joint pain and myalgias.  Skin: Negative for rash.  Neurological: Negative for dizziness, tingling, focal weakness, seizures, weakness and headaches.  Endo/Heme/Allergies: Does not bruise/bleed easily.  Psychiatric/Behavioral: Negative for depression and suicidal ideas. The patient does not have insomnia.     No Known Allergies  Patient Active Problem List   Diagnosis Date Noted  . Acute pyelonephritis 10/31/2015     Past Medical History:  Diagnosis Date  . Cancer ([place]) 2018   kidney  . Kidney stone      Past Surgical History:  Procedure Laterality Date  . APPENDECTOMY  1978   ruptured  . BREAST BIOPSY Right 10/12/2017   stereotactic biopsy - distortion - x clip  . CESAREAN SECTION  1987  . CYSTOSCOPY W/ URETERAL STENT PLACEMENT Right 11/13/2015   Procedure: CYSTOSCOPY WITH STENT REPLACEMENT;  Surgeon: Nickie Retort, MD;  Location: [facility] OR[facility];  Service: Urology;  Laterality: Right;  . CYSTOSCOPY WITH STENT PLACEMENT Right 10/31/2015   Procedure: CYSTOSCOPY WITH STENT PLACEMENT;  Surgeon: Nickie Retort, MD;  Location: [facility] OR[facility];  Service: Urology;  Laterality: Right;  . TRANSURETHRAL RESECTION OF BLADDER TUMOR N/A 11/13/2015   Procedure: TRANSURETHRAL RESECTION OF BLADDER TUMOR (TURBT);  Surgeon: Nickie Retort, MD;  Location: [facility] OR[facility];  Service: Urology;  Laterality: N/A;  . TUBAL LIGATION    . URETEROSCOPY WITH HOLMIUM LASER LITHOTRIPSY Right 11/13/2015   Procedure: URETEROSCOPY WITH HOLMIUM LASER LITHOTRIPSY;  Surgeon: Nickie Retort, MD;  Location: [facility];  Service: Urology;  Laterality: Right;    Social History   Socioeconomic History  . Marital status: Married    Spouse name: Not on file  . Number of children: Not on file  .  Years of education: Not on file  . Highest education level: Not on file  Occupational History  . Not on file  Social Needs  . Financial resource strain: Not on file  . Food insecurity:    Worry: Not on file    Inability: Not on file  . Transportation needs:    Medical: Not on file    Non-medical: Not on file  Tobacco Use  . Smoking status: Former Smoker    Packs/day: 1.00    Years: 20.00    Pack years: 20.00    Last attempt to quit: 10/27/2006    Years since quitting: 11.0  . Smokeless tobacco: Never Used  . Tobacco comment: quit 12 years   Substance and Sexual Activity  . Alcohol use: No  . Drug use: No  . Sexual activity: Yes  Lifestyle  . Physical activity:    Days per week: Not on file    Minutes per session: Not on file  . Stress: Not on file  Relationships  . Social connections:    Talks on phone: Not on file    Gets together: Not on file    Attends religious service: Not on file    Active member of club or organization: Not on file    Attends meetings of clubs or organizations: Not on file    Relationship status: Not on file  . Intimate partner violence:    Fear of current or ex partner: Not on file    Emotionally abused: Not on file    Physically abused: Not on file    Forced sexual activity: Not on file  Other Topics Concern  . Not on file  Social History Narrative  . Not on file     Family History  Problem Relation Age of Onset  . Heart attack Father   . Cancer Mother 90       breast  . Breast cancer Mother 74  . Coronary artery disease Mother 47       "open heart surgery"  . Heart attack Mother   . Developmental delay Sister        lves at [facility] center -in 52's  . Kidney disease Neg Hx      Current Outpatient Medications:  Marland Kitchen  Multiple Vitamin (MULTIVITAMIN) tablet, Take 1 tablet by mouth daily., Disp: , Rfl:  .  phentermine (ADIPEX-P) 37.5 MG tablet, Take 18.75 mg by mouth daily before breakfast. , Disp: , Rfl:  .  simvastatin (ZOCOR) 20  MG tablet, Take 20 mg by mouth at bedtime. , Disp: , Rfl:    Physical exam:  Vitals:   10/26/17 1059  BP: 133/87  Pulse: 96  Resp: 18  Temp: (!) 97.2 F (36.2 C)  TempSrc: Tympanic  Weight: 219 lb 12.8 oz (99.7 kg)  Height: 5' 6.14" (1.68 m)   Physical Exam  Constitutional: She is oriented to person, place, and time. She appears well-developed and well-nourished.  HENT:  Head: Normocephalic and atraumatic.  Eyes: Pupils are equal, round, and reactive to light. EOM are normal.  Neck: Normal range of motion.  Cardiovascular: Normal rate, regular rhythm and normal heart sounds.  Pulmonary/Chest: Effort normal and breath sounds normal.  Abdominal: Soft. Bowel sounds are normal.  Neurological: She is alert  and oriented to person, place, and time.  Skin: Skin is warm and dry.   Breast exam performed in sitting and lying down position.  No evidence of bilateral palpable axillary adenopathy.  No palpable breast masses in either breast.   CMP Latest Ref Rng & Units 11/01/2015  Glucose 65 - 99 mg/dL 164(H)  BUN 6 - 20 mg/dL 18  Creatinine 0.44 - 1.00 mg/dL 1.00  Sodium 135 - 145 mmol/L 140  Potassium 3.5 - 5.1 mmol/L 4.7  Chloride 101 - 111 mmol/L 112(H)  CO2 22 - 32 mmol/L 24  Calcium 8.9 - 10.3 mg/dL 8.5(L)  Total Protein 6.5 - 8.1 g/dL -  Total Bilirubin 0.3 - 1.2 mg/dL -  Alkaline Phos 38 - 126 U/L -  AST 15 - 41 U/L -  ALT 14 - 54 U/L -   CBC Latest Ref Rng & Units 11/01/2015  WBC 3.6 - 11.0 K/uL 11.9(H)  Hemoglobin 12.0 - 16.0 g/dL 12.7  Hematocrit 35.0 - 47.0 % 36.5  Platelets 150 - 440 K/uL 107(L)    No images are attached to the encounter.  US Breast Ltd Uni Left Inc Axilla  Result Date: 09/29/2017 CLINICAL DATA:  Possible distortion in the upper-outer right breast and possible mass in the lower outer left breast on a recent 3D screening mammogram. EXAM: DIGITAL DIAGNOSTIC BILATERAL MAMMOGRAM WITH TOMO ULTRASOUND BILATERAL BREAST COMPARISON:  Previous exam(s). ACR  Breast Density Category b: There are scattered areas of fibroglandular density. FINDINGS: 2D and 3D spot compression views of both breasts were obtained. These confirm an area of architectural distortion in the upper outer quadrant of the right breast in the middle 3rd. These also confirm an oval, circumscribed mass in the lower outer quadrant of the left breast anteriorly. There are several additional, smaller, rounded and oval circumscribed masses in that region of the breast. On physical exam, no mass is palpable in either breast or either axilla. Targeted ultrasound is performed, showing normal appearing breast tissue in the upper outer breast with no sonographically visible distortion or mass. Ultrasound of the right axilla demonstrated normal appearing right axillary lymph nodes. Multiple cysts are demonstrated in the lower outer quadrant of the left breast, measuring up to 9 mm in maximum diameter each. No solid masses were seen. IMPRESSION: 1. Architectural distortion in the upper outer right breast with no sonographic correlate. This is suspicious for malignancy or a complex sclerosing lesion. 2. Multiple left breast cysts. RECOMMENDATION: 3D stereotactic guided core needle biopsy of the area of architectural distortion in the upper outer right breast. This has been discussed with the patient and will be scheduled in consultation with Dr. Ouida Sills. I have discussed the findings and recommendations with the patient. Results were also provided in writing at the conclusion of the visit. If applicable, a reminder letter will be sent to the patient regarding the next appointment. BI-RADS CATEGORY  4: Suspicious. Electronically Signed   By: Claudie Revering M.D.   On: 09/29/2017 09:42   US Breast Ltd Uni Right Inc Axilla  Result Date: 09/29/2017 CLINICAL DATA:  Possible distortion in the upper-outer right breast and possible mass in the lower outer left breast on a recent 3D screening mammogram. EXAM: DIGITAL  DIAGNOSTIC BILATERAL MAMMOGRAM WITH TOMO ULTRASOUND BILATERAL BREAST COMPARISON:  Previous exam(s). ACR Breast Density Category b: There are scattered areas of fibroglandular density. FINDINGS: 2D and 3D spot compression views of both breasts were obtained. These confirm an area of architectural distortion in the upper  outer quadrant of the right breast in the middle 3rd. These also confirm an oval, circumscribed mass in the lower outer quadrant of the left breast anteriorly. There are several additional, smaller, rounded and oval circumscribed masses in that region of the breast. On physical exam, no mass is palpable in either breast or either axilla. Targeted ultrasound is performed, showing normal appearing breast tissue in the upper outer breast with no sonographically visible distortion or mass. Ultrasound of the right axilla demonstrated normal appearing right axillary lymph nodes. Multiple cysts are demonstrated in the lower outer quadrant of the left breast, measuring up to 9 mm in maximum diameter each. No solid masses were seen. IMPRESSION: 1. Architectural distortion in the upper outer right breast with no sonographic correlate. This is suspicious for malignancy or a complex sclerosing lesion. 2. Multiple left breast cysts. RECOMMENDATION: 3D stereotactic guided core needle biopsy of the area of architectural distortion in the upper outer right breast. This has been discussed with the patient and will be scheduled in consultation with Dr. Ouida Sills. I have discussed the findings and recommendations with the patient. Results were also provided in writing at the conclusion of the visit. If applicable, a reminder letter will be sent to the patient regarding the next appointment. BI-RADS CATEGORY  4: Suspicious. Electronically Signed   By: Claudie Revering M.D.   On: 09/29/2017 09:42   Mm Diag Breast Tomo Bilateral  Result Date: 09/29/2017 CLINICAL DATA:  Possible distortion in the upper-outer right breast  and possible mass in the lower outer left breast on a recent 3D screening mammogram. EXAM: DIGITAL DIAGNOSTIC BILATERAL MAMMOGRAM WITH TOMO ULTRASOUND BILATERAL BREAST COMPARISON:  Previous exam(s). ACR Breast Density Category b: There are scattered areas of fibroglandular density. FINDINGS: 2D and 3D spot compression views of both breasts were obtained. These confirm an area of architectural distortion in the upper outer quadrant of the right breast in the middle 3rd. These also confirm an oval, circumscribed mass in the lower outer quadrant of the left breast anteriorly. There are several additional, smaller, rounded and oval circumscribed masses in that region of the breast. On physical exam, no mass is palpable in either breast or either axilla. Targeted ultrasound is performed, showing normal appearing breast tissue in the upper outer breast with no sonographically visible distortion or mass. Ultrasound of the right axilla demonstrated normal appearing right axillary lymph nodes. Multiple cysts are demonstrated in the lower outer quadrant of the left breast, measuring up to 9 mm in maximum diameter each. No solid masses were seen. IMPRESSION: 1. Architectural distortion in the upper outer right breast with no sonographic correlate. This is suspicious for malignancy or a complex sclerosing lesion. 2. Multiple left breast cysts. RECOMMENDATION: 3D stereotactic guided core needle biopsy of the area of architectural distortion in the upper outer right breast. This has been discussed with the patient and will be scheduled in consultation with Dr. Ouida Sills. I have discussed the findings and recommendations with the patient. Results were also provided in writing at the conclusion of the visit. If applicable, a reminder letter will be sent to the patient regarding the next appointment. BI-RADS CATEGORY  4: Suspicious. Electronically Signed   By: Claudie Revering M.D.   On: 09/29/2017 09:42   Mm Clip Placement  Right  Addendum Date: 10/14/2017   ADDENDUM REPORT: 10/14/2017 16:47 ADDENDUM: PATHOLOGY ADDENDUM: Pathology: Right breast, upper outer quadrant: Small focus of low-grade invasive carcinoma, ductal carcinoma in situ, low nuclear grade, micro papillary type, calcifications  associated with invasive carcinoma, DCIS and columnar cell change. Pathology concordant with imaging findings: Yes. Recommendation: Surgical consultation. At the request of the patient, Dr. Jeanmarie Plant spoke with her by telephone on 10/14/2017 at 4:45 p.m. She reports doing well after the biopsy. Electronically Signed   By: Kristopher Oppenheim M.D.   On: 10/14/2017 16:47   Result Date: 10/14/2017 CLINICAL DATA:  Post stereotactic guided biopsy of an area of distortion in the upper-outer right breast. EXAM: DIAGNOSTIC RIGHT MAMMOGRAM POST STEREOTACTIC BIOPSY COMPARISON:  Previous exam(s). FINDINGS: Mammographic images were obtained following stereotactic guided biopsy of subtle distortion in the upper-outer right breast. X shaped biopsy marking clip is present and located at the site of the biopsied distortion in the upper-outer right breast. IMPRESSION: X shaped biopsy marking clip at site of biopsied distortion in the upper-outer right breast. Final Assessment: Post Procedure Mammograms for Marker Placement Electronically Signed: By: Everlean Alstrom M.D. On: 10/12/2017 08:56   Mm Rt Breast Bx W Loc Dev 1st Lesion Image Bx Spec Stereo Guide  Result Date: 10/12/2017 CLINICAL DATA:  57 year old female with distortion in the upper-outer right breast. EXAM: RIGHT BREAST STEREOTACTIC CORE NEEDLE BIOPSY COMPARISON:  Previous exams. FINDINGS: The patient and I discussed the procedure of stereotactic-guided biopsy including benefits and alternatives. We discussed the high likelihood of a successful procedure. We discussed the risks of the procedure including infection, bleeding, tissue injury, clip migration, and inadequate sampling. Informed written  consent was given. The usual time out protocol was performed immediately prior to the procedure. Using sterile technique and 1% Lidocaine as local anesthetic, under stereotactic guidance, a 9 gauge vacuum assisted device was used to perform core needle biopsy of the subtle distortion in the upper-outer right breast using a lateral to medial approach. Lesion quadrant: Upper-outer At the conclusion of the procedure, an X shaped tissue marker clip was deployed into the biopsy cavity. Follow-up 2-view mammogram was performed and dictated separately. IMPRESSION: Stereotactic-guided biopsy of the distortion in the upper-outer right breast. No apparent complications. Electronically Signed   By: Everlean Alstrom M.D.   On: 10/12/2017 08:54    Assessment and plan- Patient is a 57 y.o. 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  Patient had a small focus of invasive mammary carcinoma 1.7 mm on core biopsy.  The actual size of the invasive component is unclear on the mammogram and ultrasound.  At this point she will proceed with a lumpectomy and sentinel lymph node biopsy.  If her tumor is less than 5 mm in size (grade 2 or higher) or less than 10 mm in size and grade 1-she would not need Oncotype testing and would not need any adjuvant chemotherapy.  There would be a role for postlumpectomy radiation and I will refer her to radiation oncology after the results of final pathology are back.  Given that her tumor was ER PR positive there would be a role for hormone therapy for 5 years.  Discussed risks and benefits of Arimidex including all but not limited to fatigue, hot flashes, arthralgias and worsening bone health.  Patient understands and agrees to take hormone therapy upon completion of radiation.  I have given her written information on Arimidex at this  time.  She will also need to take calcium 1200 mg along with vitamin D 800 international units.  I will  obtain a baseline bone density scan at this time. Treatment will be given with a curative intent  I will schedule a follow-up for this patient based on the results of her final pathology and timing of radiation completion   Thank you for this kind referral and the opportunity to participate in the care of this patient   Visit Diagnosis 1. Malignant neoplasm of right breast in female, estrogen receptor positive, unspecified site of breast ([place])   2. Goals of care, counseling/discussion     Dr. Randa Evens, MD, MPH [facility] [phone] 10/26/2017  1:29 PM

## 2017-10-26 NOTE — Progress Notes (Signed)
  Oncology Nurse Navigator Documentation  Navigator Location: [facility]-Med Onc (10/26/17 1300) Referral date to RadOnc/MedOnc: 10/26/17 (10/26/17 1300) )Navigator Encounter Type: Initial MedOnc (10/26/17 1300)       Surgery Date: 11/01/17 (10/26/17 1300)               Treatment Phase: Pre-Tx/Tx Discussion (10/26/17 1300) Barriers/Navigation Needs: Education (10/26/17 1300)                          Time Spent with Patient: 15 (10/26/17 1300)   Met patient and her husband during her initial medical oncology consult with Dr. Janese Banks.  Patient with clinical stage I breast cancer.  Final treatment planning after surgical pathology.  Probable radiation and antihormonals only.  Patient is to call if she has any questions or needs.

## 2017-10-29 ENCOUNTER — Other Ambulatory Visit: Payer: Self-pay

## 2017-10-29 ENCOUNTER — Encounter
Admission: RE | Admit: 2017-10-29 | Discharge: 2017-10-29 | Disposition: A | Discharge status: Home / Self Care | Payer: BLUE CROSS/BLUE SHIELD | Source: Ambulatory Visit | Attending: General Surgery | Admitting: General Surgery

## 2017-10-29 HISTORY — DX: Personal history of urinary calculi: Z87.442

## 2017-10-29 HISTORY — DX: Neoplasm of unspecified behavior of bladder: D49.4

## 2017-10-29 NOTE — Patient Instructions (Signed)
Your procedure is scheduled on: 11-01-17 Report to [facility] @ 8:20 AM  Remember: Instructions that are not followed completely may result in serious medical risk, up to and including death, or upon the discretion of your surgeon and anesthesiologist your surgery may need to be rescheduled.    _x___ 1. Do not eat food after midnight the night before your procedure. NO GUM OR CANDY AFTER MIDNIGHT.  You may drink clear liquids up to 2 hours before you are scheduled to arrive at the hospital for your procedure.  Do not drink clear liquids within 2 hours of your scheduled arrival to the hospital.  Clear liquids include  --Water or Apple juice without pulp  --Clear carbohydrate beverage such as ClearFast or Gatorade  --Black Coffee or Clear Tea (No milk, no creamers, do not add anything to the coffee or Tea    __x__ 2. No Alcohol for 24 hours before or after surgery.   __x__3. No Smoking or e-cigarettes for 24 prior to surgery.  Do not use any chewable tobacco products for at least 6 hour prior to surgery   ____  4. Bring all medications with you on the day of surgery if instructed.    __x__ 5. Notify your doctor if there is any change in your medical condition     (cold, fever, infections).    x___6. On the morning of surgery brush your teeth with toothpaste and water.  You may rinse your mouth with mouth wash if you wish.  Do not swallow any toothpaste or mouthwash.   Do not wear jewelry, make-up, hairpins, clips or nail polish.  Do not wear lotions, powders, or perfumes. You may wear deodorant.  Do not shave 48 hours prior to surgery. Men may shave face and neck.  Do not bring valuables to the hospital.    [facility] is not responsible for any belongings or valuables.               Contacts, dentures or bridgework may not be worn into surgery.  Leave your suitcase in the car. After surgery it may be brought to your room.  For patients admitted to the hospital, discharge  time is determined by your treatment team.  _  Patients discharged the day of surgery will not be allowed to drive home.  You will need someone to drive you home and stay with you the night of your procedure.    Please read over the following fact sheets that you were given:   [facility] Preparing for Surgery   ____ Take anti-hypertensive listed below, cardiac, seizure, asthma, anti-reflux and psychiatric medicines. These include:  1. NONE  2.  3.  4.  5.  6.  ____Fleets enema or Magnesium Citrate as directed.   ____ Use CHG Soap or sage wipes as directed on instruction sheet   ____ Use inhalers on the day of surgery and bring to hospital day of surgery  ____ Stop Metformin and Janumet 2 days prior to surgery.    ____ Take 1/2 of usual insulin dose the night before surgery and none on the morning surgery.   ____ Follow recommendations from Cardiologist, Pulmonologist or PCP regarding stopping Aspirin, Coumadin, Plavix ,Eliquis, Effient, or Pradaxa, and Pletal.  X____Stop Anti-inflammatories such as Advil, Aleve, Ibuprofen, Motrin, Naproxen, Naprosyn, Goodies powders or aspirin products NOW- OK to take Tylenol    _x___ Stop supplements until after surgery-STOP PHENTERMINE NOW-MAY RESUME AFTER SURGERY   ____ Bring C-Pap to the  hospital.

## 2017-10-31 MED ORDER — CEFAZOLIN SODIUM-DEXTROSE 2-4 GM/100ML-% IV SOLN
2.0000 g | INTRAVENOUS | Status: AC
  Administered 2017-11-01: 2 g via INTRAVENOUS

## 2017-11-01 ENCOUNTER — Ambulatory Visit
Admission: RE | Admit: 2017-11-01 | Discharge: 2017-11-01 | Disposition: A | Discharge status: Home / Self Care | Payer: BLUE CROSS/BLUE SHIELD | Source: Ambulatory Visit | Attending: General Surgery | Admitting: General Surgery

## 2017-11-01 ENCOUNTER — Ambulatory Visit: Payer: BLUE CROSS/BLUE SHIELD | Admitting: Anesthesiology

## 2017-11-01 ENCOUNTER — Encounter: Payer: Self-pay | Admitting: Anesthesiology

## 2017-11-01 ENCOUNTER — Encounter
Admission: RE | Disposition: A | Discharge status: Home / Self Care | Payer: Self-pay | Source: Ambulatory Visit | Attending: General Surgery

## 2017-11-01 DIAGNOSIS — Z170 Estrogen receptor positive status [ER+]: Secondary | ICD-10-CM | POA: Insufficient documentation

## 2017-11-01 DIAGNOSIS — C50411 Malignant neoplasm of upper-outer quadrant of right female breast: Secondary | ICD-10-CM

## 2017-11-01 DIAGNOSIS — E785 Hyperlipidemia, unspecified: Secondary | ICD-10-CM | POA: Diagnosis not present

## 2017-11-01 DIAGNOSIS — Z79899 Other long term (current) drug therapy: Secondary | ICD-10-CM | POA: Diagnosis not present

## 2017-11-01 HISTORY — PX: PARTIAL MASTECTOMY WITH NEEDLE LOCALIZATION: SHX6008

## 2017-11-01 HISTORY — PX: BREAST LUMPECTOMY: SHX2

## 2017-11-01 HISTORY — PX: SENTINEL NODE BIOPSY: SHX6608

## 2017-11-01 SURGERY — PARTIAL MASTECTOMY WITH NEEDLE LOCALIZATION
Anesthesia: General | Site: Breast | Laterality: Right | Wound class: Clean

## 2017-11-01 MED ORDER — FENTANYL CITRATE (PF) 100 MCG/2ML IJ SOLN
INTRAMUSCULAR | Status: AC
  Filled 2017-11-01: qty 2

## 2017-11-01 MED ORDER — CEFAZOLIN SODIUM-DEXTROSE 2-4 GM/100ML-% IV SOLN
INTRAVENOUS | Status: AC
  Filled 2017-11-01: qty 100

## 2017-11-01 MED ORDER — FENTANYL CITRATE (PF) 100 MCG/2ML IJ SOLN
INTRAMUSCULAR | Status: AC
  Administered 2017-11-01: 25 ug via INTRAVENOUS
  Filled 2017-11-01: qty 2

## 2017-11-01 MED ORDER — MIDAZOLAM HCL 2 MG/2ML IJ SOLN
INTRAMUSCULAR | Status: DC | PRN
  Administered 2017-11-01: 2 mg via INTRAVENOUS

## 2017-11-01 MED ORDER — DEXAMETHASONE SODIUM PHOSPHATE 10 MG/ML IJ SOLN
INTRAMUSCULAR | Status: DC | PRN
  Administered 2017-11-01: 10 mg via INTRAVENOUS

## 2017-11-01 MED ORDER — HYDROCODONE-ACETAMINOPHEN 5-325 MG PO TABS: 1.0000 | ORAL_TABLET | ORAL | 0 refills | Status: AC | PRN

## 2017-11-01 MED ORDER — ACETAMINOPHEN 325 MG PO TABS
ORAL_TABLET | ORAL | Status: AC
  Filled 2017-11-01: qty 2

## 2017-11-01 MED ORDER — LACTATED RINGERS IV SOLN
INTRAVENOUS | Status: DC
  Administered 2017-11-01 (×2): via INTRAVENOUS

## 2017-11-01 MED ORDER — FAMOTIDINE 20 MG PO TABS
20.0000 mg | ORAL_TABLET | Freq: Once | ORAL | Status: AC
  Administered 2017-11-01: 20 mg via ORAL

## 2017-11-01 MED ORDER — FENTANYL CITRATE (PF) 100 MCG/2ML IJ SOLN
INTRAMUSCULAR | Status: DC | PRN
  Administered 2017-11-01 (×2): 50 ug via INTRAVENOUS

## 2017-11-01 MED ORDER — LIDOCAINE HCL (CARDIAC) PF 100 MG/5ML IV SOSY
PREFILLED_SYRINGE | INTRAVENOUS | Status: DC | PRN
  Administered 2017-11-01: 80 mg via INTRAVENOUS

## 2017-11-01 MED ORDER — PROPOFOL 10 MG/ML IV BOLUS
INTRAVENOUS | Status: DC | PRN
  Administered 2017-11-01: 150 mg via INTRAVENOUS

## 2017-11-01 MED ORDER — PROPOFOL 10 MG/ML IV BOLUS
INTRAVENOUS | Status: AC
  Filled 2017-11-01: qty 20

## 2017-11-01 MED ORDER — MIDAZOLAM HCL 2 MG/2ML IJ SOLN
INTRAMUSCULAR | Status: AC
  Filled 2017-11-01: qty 2

## 2017-11-01 MED ORDER — LIDOCAINE HCL (PF) 2 % IJ SOLN
INTRAMUSCULAR | Status: AC
  Filled 2017-11-01: qty 10

## 2017-11-01 MED ORDER — ONDANSETRON HCL 4 MG/2ML IJ SOLN
4.0000 mg | Freq: Once | INTRAMUSCULAR | Status: AC | PRN
  Administered 2017-11-01: 4 mg via INTRAVENOUS

## 2017-11-01 MED ORDER — TECHNETIUM TC 99M SULFUR COLLOID FILTERED
1.0000 | Freq: Once | INTRAVENOUS | Status: AC | PRN
  Administered 2017-11-01: 0.803 via INTRADERMAL

## 2017-11-01 MED ORDER — ACETAMINOPHEN 325 MG PO TABS
650.0000 mg | ORAL_TABLET | Freq: Four times a day (QID) | ORAL | Status: DC | PRN
  Administered 2017-11-01: 650 mg via ORAL

## 2017-11-01 MED ORDER — FAMOTIDINE 20 MG PO TABS
ORAL_TABLET | ORAL | Status: AC
  Administered 2017-11-01: 20 mg via ORAL
  Filled 2017-11-01: qty 1

## 2017-11-01 MED ORDER — BUPIVACAINE-EPINEPHRINE 0.5% -1:200000 IJ SOLN
INTRAMUSCULAR | Status: DC | PRN
  Administered 2017-11-01: 30 mL

## 2017-11-01 MED ORDER — FENTANYL CITRATE (PF) 100 MCG/2ML IJ SOLN
25.0000 ug | INTRAMUSCULAR | Status: DC | PRN
  Administered 2017-11-01 (×2): 25 ug via INTRAVENOUS

## 2017-11-01 MED ORDER — PHENYLEPHRINE HCL 10 MG/ML IJ SOLN
INTRAMUSCULAR | Status: DC | PRN
  Administered 2017-11-01: 8 ug via INTRAVENOUS

## 2017-11-01 SURGICAL SUPPLY — 39 items
CANISTER SUCT 1200ML W/VALVE (MISCELLANEOUS) ×4 IMPLANT
CHLORAPREP W/TINT 26ML (MISCELLANEOUS) ×4 IMPLANT
CNTNR SPEC 2.5X3XGRAD LEK (MISCELLANEOUS) ×4
CONT SPEC 4OZ STER OR WHT (MISCELLANEOUS) ×4
CONTAINER SPEC 2.5X3XGRAD LEK (MISCELLANEOUS) ×4 IMPLANT
DERMABOND ADVANCED (GAUZE/BANDAGES/DRESSINGS) ×2
DERMABOND ADVANCED .7 DNX12 (GAUZE/BANDAGES/DRESSINGS) ×2 IMPLANT
DEVICE DUBIN SPECIMEN MAMMOGRA (MISCELLANEOUS) ×4 IMPLANT
DRAPE LAPAROTOMY 77X122 PED (DRAPES) ×4 IMPLANT
DRAPE LAPAROTOMY TRNSV 106X77 (MISCELLANEOUS) ×4 IMPLANT
ELECT REM PT RETURN 9FT ADLT (ELECTROSURGICAL) ×4
ELECTRODE REM PT RTRN 9FT ADLT (ELECTROSURGICAL) ×2 IMPLANT
GLOVE BIO SURGEON STRL SZ 6.5 (GLOVE) ×3 IMPLANT
GLOVE BIO SURGEONS STRL SZ 6.5 (GLOVE) ×1
GLOVE BIOGEL M 6.5 STRL (GLOVE) ×4 IMPLANT
GOWN STRL REUS W/ TWL LRG LVL3 (GOWN DISPOSABLE) ×4 IMPLANT
GOWN STRL REUS W/TWL LRG LVL3 (GOWN DISPOSABLE) ×4
KIT TURNOVER KIT A (KITS) ×4 IMPLANT
LABEL OR SOLS (LABEL) ×4 IMPLANT
MARGIN MAP 10MM (MISCELLANEOUS) ×4 IMPLANT
NDL SAFETY ECLIPSE 18X1.5 (NEEDLE) ×2 IMPLANT
NEEDLE HYPO 18GX1.5 SHARP (NEEDLE) ×2
NEEDLE HYPO 22GX1.5 SAFETY (NEEDLE) ×4 IMPLANT
NEEDLE HYPO 25X1 1.5 SAFETY (NEEDLE) ×4 IMPLANT
PACK BASIN MINOR ARMC (MISCELLANEOUS) ×4 IMPLANT
RETRACTOR WOUND ALXS 18CM SML (MISCELLANEOUS) ×4 IMPLANT
RTRCTR WOUND ALEXIS O 18CM SML (MISCELLANEOUS) ×8
SLEVE PROBE SENORX GAMMA FIND (MISCELLANEOUS) ×4 IMPLANT
SPONGE XRAY 4X4 16PLY STRL (MISCELLANEOUS) ×4 IMPLANT
SUT ETHILON 3-0 FS-10 30 BLK (SUTURE) ×4
SUT MNCRL 4-0 (SUTURE) ×2
SUT MNCRL 4-0 27XMFL (SUTURE) ×2
SUT SILK 2 0 SH (SUTURE) IMPLANT
SUT VIC AB 3-0 SH 27 (SUTURE) ×4
SUT VIC AB 3-0 SH 27X BRD (SUTURE) ×4 IMPLANT
SUTURE EHLN 3-0 FS-10 30 BLK (SUTURE) ×2 IMPLANT
SUTURE MNCRL 4-0 27XMF (SUTURE) ×2 IMPLANT
SYR 10ML LL (SYRINGE) ×4 IMPLANT
WATER STERILE IRR 1000ML POUR (IV SOLUTION) ×4 IMPLANT

## 2017-11-01 NOTE — Brief Op Note (Signed)
11/01/2017  1:25 PM  PATIENT:  Rhonda Schneider  57 y.o. female  PRE-OPERATIVE DIAGNOSIS:  malignant neoplasm of right breast  POST-OPERATIVE DIAGNOSIS:  malignant neoplasm of right breast  PROCEDURE:  Procedure(s): PARTIAL MASTECTOMY WITH NEEDLE LOCALIZATION (Right) SENTINEL NODE BIOPSY (Right)  SURGEON:  Surgeon(s) and Role:    Herbert Pun, MD - Primary  ANESTHESIA:   local and general (LMA)   EBL:  15 mL

## 2017-11-01 NOTE — OR Nursing (Signed)
Assisted with getting dressed, tolerating po's, and pain controlled.  Advises "I just feel drunk", tearful, emotional support given, reclined in chair for rest.

## 2017-11-01 NOTE — Op Note (Signed)
Preoperative diagnosis: Right breast carcinoma.  Postoperative diagnosis: Right breast carcinoma..   Procedure: Right needle-localized breast partial mastectomy.                       Right Axillary Sentinel Lymph node biopsy  Anesthesia: GETA  Surgeon: Dr. Windell Moment  Wound Classification: Clean  Indications: Patient is a 57 y.o. 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   Findings: 1. Specimen mammography shows marker and wire on specimen 2. Pathology call refers gross examination of margins was negative 3. No other palpable mass or lymph node identified.   Description of procedure: Preoperative needle localization was performed by radiology. In the nuclear medicine suite, the subareolar region was injected with Tc-99 sulfur colloid. Localization studies were reviewed. The patient was taken to the operating room and placed supine on the operating table, and after general anesthesia the right chest and axilla were prepped and draped in the usual sterile fashion. A time-out was completed verifying correct patient, procedure, site, positioning, and implant(s) and/or special equipment prior to beginning this procedure.  By comparing the localization studies with the direction and skin entry site of the needle, the probable trajectory and location of the mass was visualized. A circumareolar skin incision was planned in such a way as to minimize the amount of dissection to reach the mass.  The skin incision was made. Flaps were raised and the location of the wire confirmed. The wire was delivered into the wound. A 2-0 silk figure-of-eight stay suture was placed around the wire and used for retraction. Dissection was then taken down circumferentially, taking care to include the  entire localizing needle and a wide margin of grossly normal tissue. The specimen and entire localizing wire were removed. The specimen was oriented and sent to radiology with the localization studies. Confirmation was received that the entire target lesion had been resected. Since the incision was in the upper outer quadrant, from the same incision, a hand-held gamma probe was used to identify the location of the hottest spot in the axilla. The axillary fascia was opened and dissection was carried down until axillary fat was found. The probe was placed and again, the point of maximal count was found. Dissection continue until nodule was identified. The probe was placed in contact with the node and 184 counts were recorded. The bed of the node measured 4 counts. No additional hot spots were identified. No clinically abnormal nodes were palpated. The procedure was terminated. Hemostasis was achieved and the wound closed in layers with deep interrupted 3-0 Vicryl and skin was closed with subcuticular suture of Monocryl 4-0. The empty space of the breast tissue was not approximated.   The patient tolerated the procedure well and was taken to the postanesthesia care unit in stable condition.   Specimen: Right Breast mass (Orientation markers used: Cranial, Caudal, Medial, Lateral,Skin, Deep)                   Right axillary Sentinel Lymph nodes  Complications: None  Estimated Blood Loss: 15 mL

## 2017-11-01 NOTE — Transfer of Care (Signed)
Immediate Anesthesia Transfer of Care Note  Patient: Rhonda Schneider  Procedure(s) Performed: PARTIAL MASTECTOMY WITH NEEDLE LOCALIZATION (Right Breast) SENTINEL NODE BIOPSY (Right Axilla)  Patient Location: PACU  Anesthesia Type:General  Level of Consciousness: awake, alert  and oriented  Airway & Oxygen Therapy: Patient Spontanous Breathing and Patient connected to face mask oxygen  Post-op Assessment: Report given to RN and Post -op Vital signs reviewed and stable  Post vital signs: Reviewed and stable  Last Vitals:  Vitals Value Taken Time  BP 106/71 11/01/2017  1:07 PM  Temp    Pulse 85 11/01/2017  1:08 PM  Resp 16 11/01/2017  1:08 PM  SpO2 98 % 11/01/2017  1:08 PM  Vitals shown include unvalidated device data.  Last Pain:  Vitals:   11/01/17 1002  TempSrc: Tympanic  PainSc: 0-No pain         Complications: No apparent anesthesia complications

## 2017-11-01 NOTE — Anesthesia Procedure Notes (Signed)
Procedure Name: LMA Insertion Date/Time: 11/01/2017 11:02 AM Performed by: Philbert Riser, CRNA Pre-anesthesia Checklist: Patient identified, Emergency Drugs available, Suction available, Patient being monitored and Timeout performed Patient Re-evaluated:Patient Re-evaluated prior to induction Oxygen Delivery Method: Circle system utilized and Simple face mask Preoxygenation: Pre-oxygenation with 100% oxygen Induction Type: IV induction Ventilation: Mask ventilation without difficulty LMA: LMA inserted LMA Size: 4.0 Number of attempts: 1 Dental Injury: Teeth and Oropharynx as per pre-operative assessment

## 2017-11-01 NOTE — Anesthesia Post-op Follow-up Note (Signed)
Anesthesia QCDR form completed.        

## 2017-11-01 NOTE — OR Nursing (Signed)
Up to wheelchair with assistance; tolerated well.  D/C'd to home.

## 2017-11-01 NOTE — Discharge Instructions (Addendum)
°  Diet: Resume home heart healthy regular diet.   Activity: No heavy lifting >20 pounds (children, pets, laundry, garbage) or strenuous activity until pain is resolved, but light activity and walking are encouraged. Do not drive or drink alcohol if taking narcotic pain medications.  Wound care: May shower with soapy water and pat dry (do not rub incisions), but no baths or submerging incision underwater until follow-up. (no swimming)   Medications: Resume all home medications. For mild to moderate pain: acetaminophen (Tylenol) or ibuprofen (if no kidney disease). Combining Tylenol with alcohol can substantially increase your risk of causing liver disease. Narcotic pain medications, if prescribed, can be used for severe pain, though may cause nausea, constipation, and drowsiness. Do not combine Tylenol and Norco within a 6 hour period as Norco contains Tylenol. If you do not need the narcotic pain medication, you do not need to fill the prescription.  Call office [phone]) at any time if any questions, worsening pain, fevers/chills, bleeding, drainage from incision site, or other concerns.    AMBULATORY SURGERY  DISCHARGE INSTRUCTIONS   1) The drugs that you were given will stay in your system until tomorrow so for the next 24 hours you should not:  A) Drive an automobile B) Make any legal decisions C) Drink any alcoholic beverage   2) You may resume regular meals tomorrow.  Today it is better to start with liquids and gradually work up to solid foods.  You may eat anything you prefer, but it is better to start with liquids, then soup and crackers, and gradually work up to solid foods.   3) Please notify your doctor immediately if you have any unusual bleeding, trouble breathing, redness and pain at the surgery site, drainage, fever, or pain not relieved by medication.    4) Additional Instructions:        Please contact your physician with any problems or Same Day Surgery  at [phone], Monday through Friday 6 am to 4 pm, or [place] at [facility] number at [phone].

## 2017-11-01 NOTE — Interval H&P Note (Signed)
History and Physical Interval Note:  11/01/2017 10:51 AM  Rhonda Schneider  has presented today for surgery, with the diagnosis of malignant neoplasm of right breast  The various methods of treatment have been discussed with the patient and family. After consideration of risks, benefits and other options for treatment, the patient has consented to  Procedure(s): PARTIAL MASTECTOMY WITH NEEDLE LOCALIZATION (Right) SENTINEL NODE BIOPSY (Right) as a surgical intervention .  The patient's history has been reviewed, patient examined, no change in status, stable for surgery.  I have reviewed the patient's chart and labs.  Right chest marked in the pre procedure room. Questions were answered to the patient's satisfaction.     Herbert Pun

## 2017-11-01 NOTE — Anesthesia Preprocedure Evaluation (Signed)
Anesthesia Evaluation  Patient identified by MRN, date of birth, ID band Patient awake    Reviewed: Allergy & Precautions, NPO status , Patient's Chart, lab work & pertinent test results, reviewed documented beta blocker date and time   Airway Mallampati: III  TM Distance: >3 FB     Dental  (+) Chipped   Pulmonary former smoker,           Cardiovascular      Neuro/Psych    GI/Hepatic   Endo/Other    Renal/GU Renal disease     Musculoskeletal   Abdominal   Peds  Hematology   Anesthesia Other Findings Obese.  Reproductive/Obstetrics                             Anesthesia Physical Anesthesia Plan  ASA: III  Anesthesia Plan: General   Post-op Pain Management:    Induction: Intravenous  PONV Risk Score and Plan:   Airway Management Planned: LMA  Additional Equipment:   Intra-op Plan:   Post-operative Plan:   Informed Consent: I have reviewed the patients History and Physical, chart, labs and discussed the procedure including the risks, benefits and alternatives for the proposed anesthesia with the patient or authorized representative who has indicated his/her understanding and acceptance.     Plan Discussed with: CRNA  Anesthesia Plan Comments:         Anesthesia Quick Evaluation

## 2017-11-02 ENCOUNTER — Encounter: Payer: Self-pay | Admitting: General Surgery

## 2017-11-02 ENCOUNTER — Encounter: Payer: Self-pay | Admitting: *Deleted

## 2017-11-02 NOTE — Anesthesia Postprocedure Evaluation (Signed)
Anesthesia Post Note  Patient: Rhonda Schneider  Procedure(s) Performed: PARTIAL MASTECTOMY WITH NEEDLE LOCALIZATION (Right Breast) SENTINEL NODE BIOPSY (Right Axilla)  Patient location during evaluation: PACU Anesthesia Type: General Level of consciousness: awake and alert Pain management: pain level controlled Vital Signs Assessment: post-procedure vital signs reviewed and stable Respiratory status: spontaneous breathing, nonlabored ventilation, respiratory function stable and patient connected to nasal cannula oxygen Cardiovascular status: blood pressure returned to baseline and stable Postop Assessment: no apparent nausea or vomiting Anesthetic complications: no     Last Vitals:  Vitals:   11/01/17 1414 11/01/17 1500  BP: 123/69 125/73  Pulse: 63 71  Resp: 14 16  Temp: (!) 36.1 C (!) 36.3 C  SpO2: 96% 97%    Last Pain:  Vitals:   11/01/17 1500  TempSrc: Temporal  PainSc: [place]

## 2017-11-02 NOTE — Progress Notes (Signed)
  Oncology Nurse Navigator Documentation  Navigator Location: [facility] (11/02/17 0900)   )Navigator Encounter Type: Telephone (11/02/17 0900)       Surgery Date: 11/01/17 (11/02/17 0900)           Treatment Initiated Date: 11/01/17 (11/02/17 0900)                                Time Spent with Patient: 15 (11/02/17 0900)   Patient is status post surgery yesterday.  Called to follow-up.  States she is doing well.  No complaints.  She is to call if she has any questions or needs.

## 2017-11-03 LAB — SURGICAL PATHOLOGY

## 2017-11-04 ENCOUNTER — Other Ambulatory Visit: Payer: Self-pay | Admitting: Radiation Oncology

## 2017-11-05 ENCOUNTER — Encounter: Payer: Self-pay | Admitting: *Deleted

## 2017-11-05 ENCOUNTER — Telehealth: Payer: Self-pay

## 2017-11-05 ENCOUNTER — Other Ambulatory Visit: Payer: Self-pay

## 2017-11-05 DIAGNOSIS — Z170 Estrogen receptor positive status [ER+]: Principal | ICD-10-CM

## 2017-11-05 DIAGNOSIS — C50911 Malignant neoplasm of unspecified site of right female breast: Secondary | ICD-10-CM

## 2017-11-05 NOTE — Progress Notes (Signed)
  Oncology Nurse Navigator Documentation  Navigator Location: [facility] (11/05/17 1200)   )Navigator Encounter Type: Telephone (11/05/17 1200) Telephone: Lahoma Crocker Call (11/05/17 1200)                       Barriers/Navigation Needs: Education;Coordination of Care (11/05/17 1200)                          Time Spent with Patient: 15 (11/05/17 1200)   Called patient per request of Dr. Janese Banks and informed her that should would not need chemotherapy.  Patient was very excited.  Informed her that Dr. Janese Banks was referring her to Dr. Baruch Gouty for radiation therapy, and his office would call her with an appointment.  She is to call me next week if she has not heard from anyone.  She is agreeable.

## 2017-11-18 ENCOUNTER — Ambulatory Visit
Admission: RE | Admit: 2017-11-18 | Discharge: 2017-11-18 | Disposition: A | Discharge status: Home / Self Care | Payer: BLUE CROSS/BLUE SHIELD | Source: Ambulatory Visit | Attending: Radiation Oncology | Admitting: Radiation Oncology

## 2017-11-18 ENCOUNTER — Encounter: Payer: Self-pay | Admitting: *Deleted

## 2017-11-18 ENCOUNTER — Other Ambulatory Visit: Payer: Self-pay

## 2017-11-18 ENCOUNTER — Encounter: Payer: Self-pay | Admitting: Radiation Oncology

## 2017-11-18 VITALS — BP 127/83 | HR 85 | Temp 96.0°F | Wt 217.7 lb

## 2017-11-18 DIAGNOSIS — Z170 Estrogen receptor positive status [ER+]: Secondary | ICD-10-CM | POA: Insufficient documentation

## 2017-11-18 DIAGNOSIS — Z87442 Personal history of urinary calculi: Secondary | ICD-10-CM | POA: Insufficient documentation

## 2017-11-18 DIAGNOSIS — Z8551 Personal history of malignant neoplasm of bladder: Secondary | ICD-10-CM | POA: Diagnosis not present

## 2017-11-18 DIAGNOSIS — C50411 Malignant neoplasm of upper-outer quadrant of right female breast: Secondary | ICD-10-CM | POA: Insufficient documentation

## 2017-11-18 DIAGNOSIS — Z87891 Personal history of nicotine dependence: Secondary | ICD-10-CM | POA: Diagnosis not present

## 2017-11-18 NOTE — Progress Notes (Signed)
  Oncology Nurse Navigator Documentation  Navigator Location: [facility] (11/18/17 1100)   )Navigator Encounter Type: Initial RadOnc (11/18/17 1100)                       Treatment Phase: Pre-Tx/Tx Discussion (11/18/17 1100)                            Time Spent with Patient: 15 (11/18/17 1100)   Met patient and her husband today during her radiation oncology consult.  No needs at this time.  She is to call if she has any questions or needs.

## 2017-11-18 NOTE — Consult Note (Signed)
NEW PATIENT EVALUATION  Name: Rhonda Schneider  MRN: [id number]  Date:   11/18/2017     DOB: 21-Apr-1961   This 57 y.o. 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  REFERRING PHYSICIAN: Schermerhorn, Gwen Her,*  CHIEF COMPLAINT:  Chief Complaint  Patient presents with  . Breast Cancer    Initial Eval    DIAGNOSIS: The encounter diagnosis was Malignant neoplasm of upper-outer quadrant of right breast in female, estrogen receptor positive ([place]).   PREVIOUS INVESTIGATIONS:  Mammogram and ultrasound reviewed Clinical notes reviewed Pathology reports reviewed  HPI: patient is a 57 year old female who presented with an abnormal mammogram of her right breast showing architectural distortion the upper outer quadrant with no sonographic correlate. This was suspicious for malignancy or complex sclerosing lesion. She underwent biopsy which was positive for a small focus of low-grade invasive carcinoma along with DCIS. Size of biopsy carcinoma was 1.7 mm grade 1 lymphovascular invasion not identified tumor was ER/PR positive HER-2/neu negative. Patient on to have a wide local excision again showing a.7 mm area of grade 1 invasive mammary carcinoma. Tumor again was ER/PR negative HER-2/neu not overexpressed. Tumor was micropapillary type.margins were clear at 2.1 mm. DCIS margin was 1.8 mm. Based the small size of the tumor she is been declined for systemic chemotherapy. One sentinel lymph node was negative for metastatic disease.She is healing well. She specifically denies breast tenderness cough or bone pain. She seen today for radiation oncology opinion.  PLANNED TREATMENT REGIMEN: right whole breast radiation  PAST MEDICAL HISTORY:  has a past medical history of Bladder tumor, Cancer ([place]) (2018), and History of kidney  stones.    PAST SURGICAL HISTORY:  Past Surgical History:  Procedure Laterality Date  . APPENDECTOMY  1978   ruptured  . BREAST BIOPSY Right 10/12/2017   stereotactic biopsy - distortion - x clip  . BREAST LUMPECTOMY Right 11/01/2017   lumpectomy low-grade invasive carcinoma, ductal carcinoma in situ  . CESAREAN SECTION  1987  . CYSTOSCOPY W/ URETERAL STENT PLACEMENT Right 11/13/2015   Procedure: CYSTOSCOPY WITH STENT REPLACEMENT;  Surgeon: Nickie Retort, MD;  Location: [facility] ORS;  Service: Urology;  Laterality: Right;  . CYSTOSCOPY WITH STENT PLACEMENT Right 10/31/2015   Procedure: CYSTOSCOPY WITH STENT PLACEMENT;  Surgeon: Nickie Retort, MD;  Location: [facility];  Service: Urology;  Laterality: Right;  . PARTIAL MASTECTOMY WITH NEEDLE LOCALIZATION Right 11/01/2017   Procedure: PARTIAL MASTECTOMY WITH NEEDLE LOCALIZATION;  Surgeon: Herbert Pun, MD;  Location: [facility];  Service: General;  Laterality: Right;  . SENTINEL NODE BIOPSY Right 11/01/2017   Procedure: SENTINEL NODE BIOPSY;  Surgeon: Herbert Pun, MD;  Location: [facility];  Service: General;  Laterality: Right;  . TRANSURETHRAL RESECTION OF BLADDER TUMOR N/A 11/13/2015   Procedure: TRANSURETHRAL RESECTION OF BLADDER TUMOR (TURBT);  Surgeon: Nickie Retort, MD;  Location: [facility];  Service: Urology;  Laterality: N/A;  . TUBAL LIGATION    . URETEROSCOPY WITH HOLMIUM LASER LITHOTRIPSY Right 11/13/2015   Procedure: URETEROSCOPY WITH HOLMIUM LASER LITHOTRIPSY;  Surgeon: Nickie Retort, MD;  Location: [facility];  Service: Urology;  Laterality: Right;    FAMILY HISTORY: family history includes Breast cancer (age of onset: 23) in her mother; Cancer (age of onset: 33) in her mother; Coronary artery disease (age of onset: 33) in her mother;  Developmental delay in her sister; Heart attack in her father and mother.  SOCIAL HISTORY:  reports that she quit smoking about 11 years ago. Her smoking use included  cigarettes. She has a 20.00 pack-year smoking history. She has never used smokeless tobacco. She reports that she does not drink alcohol or use drugs.  ALLERGIES: Patient has no known allergies.  MEDICATIONS:  Current Outpatient Medications  Medication Sig Dispense Refill  . Multiple Vitamin (MULTIVITAMIN) tablet Take 1 tablet by mouth daily.    . phentermine (ADIPEX-P) 37.5 MG tablet Take 18.75 mg by mouth daily before breakfast.     . simvastatin (ZOCOR) 20 MG tablet Take 20 mg by mouth at bedtime.      No current facility-administered medications for this encounter.     ECOG PERFORMANCE STATUS:  0 - Asymptomatic  REVIEW OF SYSTEMS:  Patient denies any weight loss, fatigue, weakness, fever, chills or night sweats. Patient denies any loss of vision, blurred vision. Patient denies any ringing  of the ears or hearing loss. No irregular heartbeat. Patient denies heart murmur or history of fainting. Patient denies any chest pain or pain radiating to her upper extremities. Patient denies any shortness of breath, difficulty breathing at night, cough or hemoptysis. Patient denies any swelling in the lower legs. Patient denies any nausea vomiting, vomiting of blood, or coffee ground material in the vomitus. Patient denies any stomach pain. Patient states has had normal bowel movements no significant constipation or diarrhea. Patient denies any dysuria, hematuria or significant nocturia. Patient denies any problems walking, swelling in the joints or loss of balance. Patient denies any skin changes, loss of hair or loss of weight. Patient denies any excessive worrying or anxiety or significant depression. Patient denies any problems with insomnia. Patient denies excessive thirst, polyuria, polydipsia. Patient denies any swollen glands, patient denies easy bruising or easy bleeding. Patient denies any recent infections, allergies or URI. Patient "s visual fields have not changed significantly in recent  time.    PHYSICAL EXAM: BP 127/83   Pulse 85   Temp (!) 96 F (35.6 C)   Wt 217 lb 11.3 oz (98.8 kg)   BMI 35.14 kg/m  Patient is large pendulous breasts. She has a wide local excision of the right breast which is healing well. No dominant mass or nodularity is noted in either breast in 2 positions examined. No axillary or supraclavicular adenopathy is appreciated.Well-developed well-nourished patient in NAD. HEENT reveals PERLA, EOMI, discs not visualized.  Oral cavity is clear. No oral mucosal lesions are identified. Neck is clear without evidence of cervical or supraclavicular adenopathy. Lungs are clear to A&P. Cardiac examination is essentially unremarkable with regular rate and rhythm without murmur rub or thrill. Abdomen is benign with no organomegaly or masses noted. Motor sensory and DTR levels are equal and symmetric in the upper and lower extremities. Cranial nerves II through XII are grossly intact. Proprioception is intact. No peripheral adenopathy or edema is identified. No motor or sensory levels are noted. Crude visual fields are within normal range.  LABORATORY DATA: pathology reports reviewed    RADIOLOGY RESULTS:mammogram and ultrasound reviewed   IMPRESSION: stage I ER/PR positive invasive mammary carcinoma the right breast as was wide local excision and sentinel node biopsy in 57 year old female  PLAN: his time I to go ahead with whole breast radiation. Based on her large breast size do not think she is a candidate for hyperfractionated course of treatment. Would plan on delivering 5040 cGy in 28  fractions. Also boost her scar another 1600 cGy based on the close DCIS margin. Risks and benefits of treatment including skin reaction fatigue alteration of blood counts possible inclusion of superficial lung all were discussed in detail with the patient. I have personally scheduled and ordered CT simulation for next week. Patient also will be a candidate for antiestrogen therapy  after completion of radiation. Patient and husband both seem to comprehend my treatment plan well.  I would like to take this opportunity to thank you for allowing me to participate in the care of your patient.Noreene Filbert, MD

## 2017-11-22 ENCOUNTER — Ambulatory Visit
Admission: RE | Admit: 2017-11-22 | Discharge: 2017-11-22 | Disposition: A | Discharge status: Home / Self Care | Payer: BLUE CROSS/BLUE SHIELD | Source: Ambulatory Visit | Attending: Radiation Oncology | Admitting: Radiation Oncology

## 2017-11-22 DIAGNOSIS — Z170 Estrogen receptor positive status [ER+]: Secondary | ICD-10-CM | POA: Diagnosis not present

## 2017-11-22 DIAGNOSIS — C50411 Malignant neoplasm of upper-outer quadrant of right female breast: Secondary | ICD-10-CM | POA: Diagnosis not present

## 2017-11-22 DIAGNOSIS — Z510 Encounter for antineoplastic radiation therapy: Secondary | ICD-10-CM | POA: Insufficient documentation

## 2017-11-23 DIAGNOSIS — C50411 Malignant neoplasm of upper-outer quadrant of right female breast: Secondary | ICD-10-CM | POA: Diagnosis not present

## 2017-11-25 ENCOUNTER — Ambulatory Visit
Admission: RE | Admit: 2017-11-25 | Discharge: 2017-11-25 | Disposition: A | Discharge status: Home / Self Care | Payer: BLUE CROSS/BLUE SHIELD | Source: Ambulatory Visit | Attending: Oncology | Admitting: Oncology

## 2017-11-25 DIAGNOSIS — Z170 Estrogen receptor positive status [ER+]: Secondary | ICD-10-CM | POA: Insufficient documentation

## 2017-11-25 DIAGNOSIS — C50911 Malignant neoplasm of unspecified site of right female breast: Secondary | ICD-10-CM | POA: Diagnosis not present

## 2017-11-26 ENCOUNTER — Other Ambulatory Visit: Payer: Self-pay | Admitting: *Deleted

## 2017-11-26 DIAGNOSIS — Z170 Estrogen receptor positive status [ER+]: Principal | ICD-10-CM

## 2017-11-26 DIAGNOSIS — C50411 Malignant neoplasm of upper-outer quadrant of right female breast: Secondary | ICD-10-CM

## 2017-11-30 ENCOUNTER — Ambulatory Visit
Admission: RE | Admit: 2017-11-30 | Discharge: 2017-11-30 | Disposition: A | Discharge status: Home / Self Care | Payer: BLUE CROSS/BLUE SHIELD | Source: Ambulatory Visit | Attending: Radiation Oncology | Admitting: Radiation Oncology

## 2017-11-30 DIAGNOSIS — Z510 Encounter for antineoplastic radiation therapy: Secondary | ICD-10-CM | POA: Insufficient documentation

## 2017-11-30 DIAGNOSIS — C50411 Malignant neoplasm of upper-outer quadrant of right female breast: Secondary | ICD-10-CM | POA: Diagnosis not present

## 2017-11-30 DIAGNOSIS — Z170 Estrogen receptor positive status [ER+]: Secondary | ICD-10-CM | POA: Insufficient documentation

## 2017-12-01 ENCOUNTER — Ambulatory Visit
Admission: RE | Admit: 2017-12-01 | Discharge: 2017-12-01 | Disposition: A | Discharge status: Home / Self Care | Payer: BLUE CROSS/BLUE SHIELD | Source: Ambulatory Visit | Attending: Radiation Oncology | Admitting: Radiation Oncology

## 2017-12-01 DIAGNOSIS — C50411 Malignant neoplasm of upper-outer quadrant of right female breast: Secondary | ICD-10-CM | POA: Diagnosis not present

## 2017-12-02 ENCOUNTER — Ambulatory Visit
Admission: RE | Admit: 2017-12-02 | Discharge: 2017-12-02 | Disposition: A | Discharge status: Home / Self Care | Payer: BLUE CROSS/BLUE SHIELD | Source: Ambulatory Visit | Attending: Radiation Oncology | Admitting: Radiation Oncology

## 2017-12-02 DIAGNOSIS — C50411 Malignant neoplasm of upper-outer quadrant of right female breast: Secondary | ICD-10-CM | POA: Diagnosis not present

## 2017-12-03 ENCOUNTER — Ambulatory Visit
Admission: RE | Admit: 2017-12-03 | Discharge: 2017-12-03 | Disposition: A | Discharge status: Home / Self Care | Payer: BLUE CROSS/BLUE SHIELD | Source: Ambulatory Visit | Attending: Radiation Oncology | Admitting: Radiation Oncology

## 2017-12-03 DIAGNOSIS — C50411 Malignant neoplasm of upper-outer quadrant of right female breast: Secondary | ICD-10-CM | POA: Diagnosis not present

## 2017-12-06 ENCOUNTER — Ambulatory Visit
Admission: RE | Admit: 2017-12-06 | Discharge: 2017-12-06 | Disposition: A | Discharge status: Home / Self Care | Payer: BLUE CROSS/BLUE SHIELD | Source: Ambulatory Visit | Attending: Radiation Oncology | Admitting: Radiation Oncology

## 2017-12-06 DIAGNOSIS — C50411 Malignant neoplasm of upper-outer quadrant of right female breast: Secondary | ICD-10-CM | POA: Diagnosis not present

## 2017-12-07 ENCOUNTER — Ambulatory Visit
Admission: RE | Admit: 2017-12-07 | Discharge: 2017-12-07 | Disposition: A | Discharge status: Home / Self Care | Payer: BLUE CROSS/BLUE SHIELD | Source: Ambulatory Visit | Attending: Radiation Oncology | Admitting: Radiation Oncology

## 2017-12-07 DIAGNOSIS — C50411 Malignant neoplasm of upper-outer quadrant of right female breast: Secondary | ICD-10-CM | POA: Diagnosis not present

## 2017-12-08 ENCOUNTER — Ambulatory Visit
Admission: RE | Admit: 2017-12-08 | Discharge: 2017-12-08 | Disposition: A | Discharge status: Home / Self Care | Payer: BLUE CROSS/BLUE SHIELD | Source: Ambulatory Visit | Attending: Radiation Oncology | Admitting: Radiation Oncology

## 2017-12-08 DIAGNOSIS — C50411 Malignant neoplasm of upper-outer quadrant of right female breast: Secondary | ICD-10-CM | POA: Diagnosis not present

## 2017-12-09 ENCOUNTER — Ambulatory Visit
Admission: RE | Admit: 2017-12-09 | Discharge: 2017-12-09 | Disposition: A | Discharge status: Home / Self Care | Payer: BLUE CROSS/BLUE SHIELD | Source: Ambulatory Visit | Attending: Radiation Oncology | Admitting: Radiation Oncology

## 2017-12-09 DIAGNOSIS — C50411 Malignant neoplasm of upper-outer quadrant of right female breast: Secondary | ICD-10-CM | POA: Diagnosis not present

## 2017-12-10 ENCOUNTER — Ambulatory Visit
Admission: RE | Admit: 2017-12-10 | Discharge: 2017-12-10 | Disposition: A | Discharge status: Home / Self Care | Payer: BLUE CROSS/BLUE SHIELD | Source: Ambulatory Visit | Attending: Radiation Oncology | Admitting: Radiation Oncology

## 2017-12-10 DIAGNOSIS — C50411 Malignant neoplasm of upper-outer quadrant of right female breast: Secondary | ICD-10-CM | POA: Diagnosis not present

## 2017-12-12 ENCOUNTER — Ambulatory Visit: Admission: RE | Admit: 2017-12-12 | Payer: BLUE CROSS/BLUE SHIELD | Source: Ambulatory Visit

## 2017-12-13 ENCOUNTER — Ambulatory Visit
Admission: RE | Admit: 2017-12-13 | Discharge: 2017-12-13 | Disposition: A | Discharge status: Home / Self Care | Payer: BLUE CROSS/BLUE SHIELD | Source: Ambulatory Visit | Attending: Radiation Oncology | Admitting: Radiation Oncology

## 2017-12-13 ENCOUNTER — Ambulatory Visit: Payer: BLUE CROSS/BLUE SHIELD

## 2017-12-13 DIAGNOSIS — C50411 Malignant neoplasm of upper-outer quadrant of right female breast: Secondary | ICD-10-CM | POA: Diagnosis not present

## 2017-12-14 ENCOUNTER — Ambulatory Visit
Admission: RE | Admit: 2017-12-14 | Discharge: 2017-12-14 | Disposition: A | Discharge status: Home / Self Care | Payer: BLUE CROSS/BLUE SHIELD | Source: Ambulatory Visit | Attending: Radiation Oncology | Admitting: Radiation Oncology

## 2017-12-14 DIAGNOSIS — C50411 Malignant neoplasm of upper-outer quadrant of right female breast: Secondary | ICD-10-CM | POA: Diagnosis not present

## 2017-12-15 ENCOUNTER — Ambulatory Visit
Admission: RE | Admit: 2017-12-15 | Discharge: 2017-12-15 | Disposition: A | Discharge status: Home / Self Care | Payer: BLUE CROSS/BLUE SHIELD | Source: Ambulatory Visit | Attending: Radiation Oncology | Admitting: Radiation Oncology

## 2017-12-15 ENCOUNTER — Inpatient Hospital Stay: Payer: BLUE CROSS/BLUE SHIELD | Attending: Oncology

## 2017-12-15 DIAGNOSIS — Z170 Estrogen receptor positive status [ER+]: Secondary | ICD-10-CM | POA: Insufficient documentation

## 2017-12-15 DIAGNOSIS — C50411 Malignant neoplasm of upper-outer quadrant of right female breast: Secondary | ICD-10-CM | POA: Insufficient documentation

## 2017-12-15 LAB — CBC
HEMATOCRIT: 41.5 % (ref 35.0–47.0)
Hemoglobin: 14.2 g/dL (ref 12.0–16.0)
MCH: 30.5 pg (ref 26.0–34.0)
MCHC: 34.1 g/dL (ref 32.0–36.0)
MCV: 89.5 fL (ref 80.0–100.0)
Platelets: 169 10*3/uL (ref 150–440)
RBC: 4.64 MIL/uL (ref 3.80–5.20)
RDW: 12.6 % (ref 11.5–14.5)
WBC: 4.2 10*3/uL (ref 3.6–11.0)

## 2017-12-16 ENCOUNTER — Ambulatory Visit
Admission: RE | Admit: 2017-12-16 | Discharge: 2017-12-16 | Disposition: A | Discharge status: Home / Self Care | Payer: BLUE CROSS/BLUE SHIELD | Source: Ambulatory Visit | Attending: Radiation Oncology | Admitting: Radiation Oncology

## 2017-12-16 DIAGNOSIS — C50411 Malignant neoplasm of upper-outer quadrant of right female breast: Secondary | ICD-10-CM | POA: Diagnosis not present

## 2017-12-17 ENCOUNTER — Ambulatory Visit
Admission: RE | Admit: 2017-12-17 | Discharge: 2017-12-17 | Disposition: A | Discharge status: Home / Self Care | Payer: BLUE CROSS/BLUE SHIELD | Source: Ambulatory Visit | Attending: Radiation Oncology | Admitting: Radiation Oncology

## 2017-12-17 DIAGNOSIS — C50411 Malignant neoplasm of upper-outer quadrant of right female breast: Secondary | ICD-10-CM | POA: Diagnosis not present

## 2017-12-20 ENCOUNTER — Ambulatory Visit
Admission: RE | Admit: 2017-12-20 | Discharge: 2017-12-20 | Disposition: A | Discharge status: Home / Self Care | Payer: BLUE CROSS/BLUE SHIELD | Source: Ambulatory Visit | Attending: Radiation Oncology | Admitting: Radiation Oncology

## 2017-12-20 DIAGNOSIS — C50411 Malignant neoplasm of upper-outer quadrant of right female breast: Secondary | ICD-10-CM | POA: Diagnosis not present

## 2017-12-21 ENCOUNTER — Ambulatory Visit
Admission: RE | Admit: 2017-12-21 | Discharge: 2017-12-21 | Disposition: A | Discharge status: Home / Self Care | Payer: BLUE CROSS/BLUE SHIELD | Source: Ambulatory Visit | Attending: Radiation Oncology | Admitting: Radiation Oncology

## 2017-12-21 DIAGNOSIS — C50411 Malignant neoplasm of upper-outer quadrant of right female breast: Secondary | ICD-10-CM | POA: Diagnosis not present

## 2017-12-22 ENCOUNTER — Ambulatory Visit
Admission: RE | Admit: 2017-12-22 | Discharge: 2017-12-22 | Disposition: A | Discharge status: Home / Self Care | Payer: BLUE CROSS/BLUE SHIELD | Source: Ambulatory Visit | Attending: Radiation Oncology | Admitting: Radiation Oncology

## 2017-12-22 DIAGNOSIS — C50411 Malignant neoplasm of upper-outer quadrant of right female breast: Secondary | ICD-10-CM | POA: Diagnosis not present

## 2017-12-23 ENCOUNTER — Ambulatory Visit
Admission: RE | Admit: 2017-12-23 | Discharge: 2017-12-23 | Disposition: A | Discharge status: Home / Self Care | Payer: BLUE CROSS/BLUE SHIELD | Source: Ambulatory Visit | Attending: Radiation Oncology | Admitting: Radiation Oncology

## 2017-12-23 DIAGNOSIS — C50411 Malignant neoplasm of upper-outer quadrant of right female breast: Secondary | ICD-10-CM | POA: Diagnosis not present

## 2017-12-24 ENCOUNTER — Ambulatory Visit
Admission: RE | Admit: 2017-12-24 | Discharge: 2017-12-24 | Disposition: A | Discharge status: Home / Self Care | Payer: BLUE CROSS/BLUE SHIELD | Source: Ambulatory Visit | Attending: Radiation Oncology | Admitting: Radiation Oncology

## 2017-12-24 DIAGNOSIS — C50411 Malignant neoplasm of upper-outer quadrant of right female breast: Secondary | ICD-10-CM | POA: Diagnosis not present

## 2017-12-28 ENCOUNTER — Ambulatory Visit
Admission: RE | Admit: 2017-12-28 | Discharge: 2017-12-28 | Disposition: A | Discharge status: Home / Self Care | Payer: BLUE CROSS/BLUE SHIELD | Source: Ambulatory Visit | Attending: Radiation Oncology | Admitting: Radiation Oncology

## 2017-12-28 DIAGNOSIS — Z170 Estrogen receptor positive status [ER+]: Secondary | ICD-10-CM | POA: Insufficient documentation

## 2017-12-28 DIAGNOSIS — Z510 Encounter for antineoplastic radiation therapy: Secondary | ICD-10-CM | POA: Diagnosis not present

## 2017-12-28 DIAGNOSIS — C50411 Malignant neoplasm of upper-outer quadrant of right female breast: Secondary | ICD-10-CM | POA: Diagnosis present

## 2017-12-29 ENCOUNTER — Ambulatory Visit
Admission: RE | Admit: 2017-12-29 | Discharge: 2017-12-29 | Disposition: A | Discharge status: Home / Self Care | Payer: BLUE CROSS/BLUE SHIELD | Source: Ambulatory Visit | Attending: Radiation Oncology | Admitting: Radiation Oncology

## 2017-12-29 ENCOUNTER — Inpatient Hospital Stay: Payer: BLUE CROSS/BLUE SHIELD | Attending: Oncology

## 2017-12-29 DIAGNOSIS — M8580 Other specified disorders of bone density and structure, unspecified site: Secondary | ICD-10-CM | POA: Insufficient documentation

## 2017-12-29 DIAGNOSIS — Z9011 Acquired absence of right breast and nipple: Secondary | ICD-10-CM | POA: Insufficient documentation

## 2017-12-29 DIAGNOSIS — Z87891 Personal history of nicotine dependence: Secondary | ICD-10-CM | POA: Diagnosis not present

## 2017-12-29 DIAGNOSIS — Z170 Estrogen receptor positive status [ER+]: Secondary | ICD-10-CM | POA: Insufficient documentation

## 2017-12-29 DIAGNOSIS — C50911 Malignant neoplasm of unspecified site of right female breast: Secondary | ICD-10-CM | POA: Insufficient documentation

## 2017-12-29 DIAGNOSIS — C50411 Malignant neoplasm of upper-outer quadrant of right female breast: Secondary | ICD-10-CM

## 2017-12-29 LAB — CBC
HCT: 41.4 % (ref 35.0–47.0)
Hemoglobin: 14.1 g/dL (ref 12.0–16.0)
MCH: 30.5 pg (ref 26.0–34.0)
MCHC: 34.1 g/dL (ref 32.0–36.0)
MCV: 89.4 fL (ref 80.0–100.0)
Platelets: 165 10*3/uL (ref 150–440)
RBC: 4.63 MIL/uL (ref 3.80–5.20)
RDW: 12.5 % (ref 11.5–14.5)
WBC: 4.1 10*3/uL (ref 3.6–11.0)

## 2017-12-30 ENCOUNTER — Ambulatory Visit
Admission: RE | Admit: 2017-12-30 | Discharge: 2017-12-30 | Disposition: A | Discharge status: Home / Self Care | Payer: BLUE CROSS/BLUE SHIELD | Source: Ambulatory Visit | Attending: Radiation Oncology | Admitting: Radiation Oncology

## 2017-12-30 DIAGNOSIS — C50411 Malignant neoplasm of upper-outer quadrant of right female breast: Secondary | ICD-10-CM | POA: Diagnosis not present

## 2017-12-31 ENCOUNTER — Ambulatory Visit
Admission: RE | Admit: 2017-12-31 | Discharge: 2017-12-31 | Disposition: A | Discharge status: Home / Self Care | Payer: BLUE CROSS/BLUE SHIELD | Source: Ambulatory Visit | Attending: Radiation Oncology | Admitting: Radiation Oncology

## 2017-12-31 DIAGNOSIS — C50411 Malignant neoplasm of upper-outer quadrant of right female breast: Secondary | ICD-10-CM | POA: Diagnosis not present

## 2018-01-03 ENCOUNTER — Ambulatory Visit
Admission: RE | Admit: 2018-01-03 | Discharge: 2018-01-03 | Disposition: A | Discharge status: Home / Self Care | Payer: BLUE CROSS/BLUE SHIELD | Source: Ambulatory Visit | Attending: Radiation Oncology | Admitting: Radiation Oncology

## 2018-01-03 ENCOUNTER — Ambulatory Visit: Payer: BLUE CROSS/BLUE SHIELD

## 2018-01-03 DIAGNOSIS — C50411 Malignant neoplasm of upper-outer quadrant of right female breast: Secondary | ICD-10-CM | POA: Diagnosis not present

## 2018-01-04 ENCOUNTER — Ambulatory Visit
Admission: RE | Admit: 2018-01-04 | Discharge: 2018-01-04 | Disposition: A | Discharge status: Home / Self Care | Payer: BLUE CROSS/BLUE SHIELD | Source: Ambulatory Visit | Attending: Radiation Oncology | Admitting: Radiation Oncology

## 2018-01-04 DIAGNOSIS — C50411 Malignant neoplasm of upper-outer quadrant of right female breast: Secondary | ICD-10-CM | POA: Diagnosis not present

## 2018-01-05 ENCOUNTER — Ambulatory Visit
Admission: RE | Admit: 2018-01-05 | Discharge: 2018-01-05 | Disposition: A | Discharge status: Home / Self Care | Payer: BLUE CROSS/BLUE SHIELD | Source: Ambulatory Visit | Attending: Radiation Oncology | Admitting: Radiation Oncology

## 2018-01-05 DIAGNOSIS — C50411 Malignant neoplasm of upper-outer quadrant of right female breast: Secondary | ICD-10-CM | POA: Diagnosis not present

## 2018-01-06 ENCOUNTER — Ambulatory Visit
Admission: RE | Admit: 2018-01-06 | Discharge: 2018-01-06 | Disposition: A | Discharge status: Home / Self Care | Payer: BLUE CROSS/BLUE SHIELD | Source: Ambulatory Visit | Attending: Radiation Oncology | Admitting: Radiation Oncology

## 2018-01-06 ENCOUNTER — Inpatient Hospital Stay: Payer: BLUE CROSS/BLUE SHIELD | Admitting: Oncology

## 2018-01-06 ENCOUNTER — Encounter: Payer: Self-pay | Admitting: Oncology

## 2018-01-06 VITALS — BP 119/79 | HR 80 | Temp 98.3°F | Resp 18 | Ht 66.0 in | Wt 216.3 lb

## 2018-01-06 DIAGNOSIS — M8580 Other specified disorders of bone density and structure, unspecified site: Secondary | ICD-10-CM

## 2018-01-06 DIAGNOSIS — Z87891 Personal history of nicotine dependence: Secondary | ICD-10-CM

## 2018-01-06 DIAGNOSIS — C50911 Malignant neoplasm of unspecified site of right female breast: Secondary | ICD-10-CM

## 2018-01-06 DIAGNOSIS — Z9011 Acquired absence of right breast and nipple: Secondary | ICD-10-CM | POA: Diagnosis not present

## 2018-01-06 DIAGNOSIS — Z170 Estrogen receptor positive status [ER+]: Secondary | ICD-10-CM | POA: Diagnosis not present

## 2018-01-06 DIAGNOSIS — Z7189 Other specified counseling: Secondary | ICD-10-CM

## 2018-01-06 DIAGNOSIS — C50411 Malignant neoplasm of upper-outer quadrant of right female breast: Secondary | ICD-10-CM | POA: Diagnosis not present

## 2018-01-06 MED ORDER — ANASTROZOLE 1 MG PO TABS: 1.0000 mg | ORAL_TABLET | Freq: Every day | ORAL | 3 refills | Status: DC

## 2018-01-06 NOTE — Progress Notes (Signed)
Here to speak about hormones tx

## 2018-01-07 ENCOUNTER — Ambulatory Visit
Admission: RE | Admit: 2018-01-07 | Discharge: 2018-01-07 | Disposition: A | Discharge status: Home / Self Care | Payer: BLUE CROSS/BLUE SHIELD | Source: Ambulatory Visit | Attending: Radiation Oncology | Admitting: Radiation Oncology

## 2018-01-07 DIAGNOSIS — Z170 Estrogen receptor positive status [ER+]: Secondary | ICD-10-CM

## 2018-01-07 DIAGNOSIS — C50411 Malignant neoplasm of upper-outer quadrant of right female breast: Secondary | ICD-10-CM | POA: Diagnosis not present

## 2018-01-07 DIAGNOSIS — C50911 Malignant neoplasm of unspecified site of right female breast: Secondary | ICD-10-CM | POA: Insufficient documentation

## 2018-01-07 NOTE — Progress Notes (Signed)
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  She recently had a screening bilateral mammogram which showed left breast cysts and architectural distortion in her right breast warranting a biopsy.  No evidence of abnormal axillary adenopathy noted on scans.  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  Size of invasive carcinoma was 1.7 mm, grade 1.  Lymphovascular invasion not identified.  ER PR positive and HER-2/neu negative.    Patient has seen Dr. Peyton Najjar and is scheduled to undergo lumpectomy and sentinel lymph node biopsy next week.  Patient's family history significant for her mother who had breast cancer in her 58s.  Patient herself has not had any prior abnormal mammograms or breast biopsies.  She is currently postmenopausal and has not had any menstrual cycles for about 3 years.  She used birth control remotely in the 37s.  Currently she is not on any hormone replacement therapy.  She is G2, P2 L2.  Final pathology showed 7 mm invasive mammary carcinoma, grade 1 with negative margins.  Sentinel lymph nodes were negative.  pT1b pN0 ER PR positive HER-2/neu negative  Given that tumor was grade 1 and <10 mm, she did not require adjuvant chemotherapy or oncotype testing  Interval history- she will be finishing her radiation treatment soon. She is tolerating it well without any significant side efefcts  ECOG PS- 0 Pain scale- 0 Opioid associated  constipation- no  Review of systems- Review of Systems  Constitutional: Negative for chills, fever, malaise/fatigue and weight loss.  HENT: Negative for congestion, ear discharge and nosebleeds.   Eyes: Negative for blurred vision.  Respiratory: Negative for cough, hemoptysis, sputum production, shortness of breath and wheezing.   Cardiovascular: Negative for chest pain, palpitations, orthopnea and claudication.  Gastrointestinal: Negative for abdominal pain, blood in stool, constipation, diarrhea, heartburn, melena, nausea and vomiting.  Genitourinary: Negative for dysuria, flank pain, frequency, hematuria and urgency.  Musculoskeletal: Negative for back pain, joint pain and myalgias.  Skin: Negative for rash.  Neurological: Negative for dizziness, tingling, focal weakness, seizures, weakness and headaches.  Endo/Heme/Allergies: Does not bruise/bleed easily.  Psychiatric/Behavioral: Negative for depression and suicidal ideas. The patient does not have insomnia.        No Known Allergies   Past Medical History:  Diagnosis Date  . Bladder tumor   . Breast cancer ([place])   . Cancer ([place]) 2018   kidney  . History of kidney stones    H/O     Past Surgical History:  Procedure Laterality Date  . APPENDECTOMY  1978   ruptured  . BREAST BIOPSY Right 10/12/2017   stereotactic biopsy - distortion - x clip  . BREAST LUMPECTOMY Right 11/01/2017   lumpectomy low-grade invasive carcinoma, ductal carcinoma in situ  . CESAREAN SECTION  1987  . CYSTOSCOPY W/ URETERAL STENT PLACEMENT Right 11/13/2015   Procedure: CYSTOSCOPY WITH STENT REPLACEMENT;  Surgeon: Nickie Retort, MD;  Location: [facility];  Service: Urology;  Laterality: Right;  . CYSTOSCOPY WITH STENT PLACEMENT Right 10/31/2015  Procedure: CYSTOSCOPY WITH STENT PLACEMENT;  Surgeon: Nickie Retort, MD;  Location: [facility];  Service: Urology;  Laterality: Right;  . KIDNEY CYST REMOVAL     2018  . PARTIAL MASTECTOMY WITH  NEEDLE LOCALIZATION Right 11/01/2017   Procedure: PARTIAL MASTECTOMY WITH NEEDLE LOCALIZATION;  Surgeon: Herbert Pun, MD;  Location: [facility];  Service: General;  Laterality: Right;  . SENTINEL NODE BIOPSY Right 11/01/2017   Procedure: SENTINEL NODE BIOPSY;  Surgeon: Herbert Pun, MD;  Location: [facility];  Service: General;  Laterality: Right;  . TRANSURETHRAL RESECTION OF BLADDER TUMOR N/A 11/13/2015   Procedure: TRANSURETHRAL RESECTION OF BLADDER TUMOR (TURBT);  Surgeon: Nickie Retort, MD;  Location: [facility];  Service: Urology;  Laterality: N/A;  . TUBAL LIGATION    . URETEROSCOPY WITH HOLMIUM LASER LITHOTRIPSY Right 11/13/2015   Procedure: URETEROSCOPY WITH HOLMIUM LASER LITHOTRIPSY;  Surgeon: Nickie Retort, MD;  Location: [facility];  Service: Urology;  Laterality: Right;    Social History   Socioeconomic History  . Marital status: Married    Spouse name: Not on file  . Number of children: Not on file  . Years of education: Not on file  . Highest education level: Not on file  Occupational History  . Not on file  Social Needs  . Financial resource strain: Not on file  . Food insecurity:    Worry: Not on file    Inability: Not on file  . Transportation needs:    Medical: Not on file    Non-medical: Not on file  Tobacco Use  . Smoking status: Former Smoker    Packs/day: 1.00    Years: 20.00    Pack years: 20.00    Types: Cigarettes    Last attempt to quit: 10/27/2006    Years since quitting: 11.2  . Smokeless tobacco: Never Used  Substance and Sexual Activity  . Alcohol use: No  . Drug use: No  . Sexual activity: Yes  Lifestyle  . Physical activity:    Days per week: Not on file    Minutes per session: Not on file  . Stress: Not on file  Relationships  . Social connections:    Talks on phone: Not on file    Gets together: Not on file    Attends religious service: Not on file    Active member of club or organization: Not on file    Attends  meetings of clubs or organizations: Not on file    Relationship status: Not on file  . Intimate partner violence:    Fear of current or ex partner: Not on file    Emotionally abused: Not on file    Physically abused: Not on file    Forced sexual activity: Not on file  Other Topics Concern  . Not on file  Social History Narrative  . Not on file    Family History  Problem Relation Age of Onset  . Heart attack Father   . Cancer Mother 71       breast  . Breast cancer Mother 35  . Coronary artery disease Mother 38       "open heart surgery"  . Heart attack Mother   . Developmental delay Sister        lves at [facility] center -in 72's  . Kidney disease Neg Hx      Current Outpatient Medications:  Marland Kitchen  Multiple Vitamin (MULTIVITAMIN) tablet, Take 1 tablet by mouth daily., Disp: , Rfl:  .  phentermine (ADIPEX-P) 37.5 MG tablet, Take 18.75 mg by mouth daily before breakfast. , Disp: , Rfl:  .  simvastatin (ZOCOR) 20 MG tablet, Take 20 mg by mouth at bedtime. , Disp: , Rfl:  .  anastrozole (ARIMIDEX) 1 MG tablet, Take 1 tablet (1 mg total) by mouth daily., Disp: 30 tablet, Rfl: 3  Physical exam:  Vitals:   01/06/18 1000  BP: 119/79  Pulse: 80  Resp: 18  Temp: 98.3 F (36.8 C)  TempSrc: Tympanic  Weight: 216 lb 4.8 oz (98.1 kg)  Height: '5\' 6"'  (1.676 m)   Physical Exam  Constitutional: She is oriented to person, place, and time. She appears well-developed and well-nourished.  HENT:  Head: Normocephalic and atraumatic.  Eyes: Pupils are equal, round, and reactive to light. EOM are normal.  Neck: Normal range of motion.  Cardiovascular: Normal rate, regular rhythm and normal heart sounds.  Pulmonary/Chest: Effort normal and breath sounds normal.  Abdominal: Soft. Bowel sounds are normal.  Neurological: She is alert and oriented to person, place, and time.  Skin: Skin is warm and dry.  Patient is status post right lumpectomy with a well-healed surgical scar.  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  CMP Latest Ref Rng & Units 11/01/2015  Glucose 65 - 99 mg/dL 164(H)  BUN 6 - 20 mg/dL 18  Creatinine 0.44 - 1.00 mg/dL 1.00  Sodium 135 - 145 mmol/L 140  Potassium 3.5 - 5.1 mmol/L 4.7  Chloride 101 - 111 mmol/L 112(H)  CO2 22 - 32 mmol/L 24  Calcium 8.9 - 10.3 mg/dL 8.5(L)  Total Protein 6.5 - 8.1 g/dL -  Total Bilirubin 0.3 - 1.2 mg/dL -  Alkaline Phos 38 - 126 U/L -  AST 15 - 41 U/L -  ALT 14 - 54 U/L -   CBC Latest Ref Rng & Units 12/29/2017  WBC 3.6 - 11.0 K/uL 4.1  Hemoglobin 12.0 - 16.0 g/dL 14.1  Hematocrit 35.0 - 47.0 % 41.4  Platelets 150 - 440 K/uL 165     Assessment and plan- Patient is a 57 y.o. female with invasive mammary carcinoma of the right breast stage IA pT1b pN0 M0 ER PR positive HER-2/neu negative status post lumpectomy  Given the patient's final pathology showed the tumor was less than 10 mm and grade 1 she did not require any adjuvant chemotherapy Oncotype testing.  Patient will be completing her radiation treatment next week.  Given that her tumor is ER PR +5 years of hormone therapy would be recommended.  Discussed that AI would be better than tamoxifen and improving disease-free survival but does not improve overall survival.  We did obtain a baseline bone density scan which shows that patient has osteopenia with T score of -2.1 at her spine.  Her 10-year probability of a major osteoporotic fracture and a major hip fracture is less than 20% and 3% respectively.  Therefore she does not need any adjuvant bisphosphonate therapy at this time.  I recommend that she should start taking Arimidex 1 mg p.o. daily along with calcium 1200 mg and vitamin D 800 international units daily upon completion of radiation treatment.  Discussed risks and benefits of AI including all but not limited to fatigue, hot flashes, arthralgias and worsening bone health as well as  cardiovascular risk factors.  Patient understands and agrees to proceed as planned.  I will see her back in 6 weeks time  with CMP to see how she is tolerating her medication.  Treatment will be given with a curative intent.  Patient is in understanding of the plan.  Cancer Staging Malignant neoplasm of right breast in female, estrogen receptor positive ([place]) Staging form: Breast, AJCC 8th Edition - Pathologic stage from 01/07/2018: Stage IA (pT1b, pN0, cM0, G1, ER+, PR+, HER2-) - Signed by Sindy Guadeloupe, MD on 01/07/2018     Visit Diagnosis 1. Malignant neoplasm of right breast in female, estrogen receptor positive, unspecified site of breast ([place])   2. Goals of care, counseling/discussion      Dr. Randa Evens, MD, MPH [facility] at [facility] [phone] 01/07/2018 12:11 PM

## 2018-01-10 ENCOUNTER — Ambulatory Visit
Admission: RE | Admit: 2018-01-10 | Discharge: 2018-01-10 | Disposition: A | Discharge status: Home / Self Care | Payer: BLUE CROSS/BLUE SHIELD | Source: Ambulatory Visit | Attending: Radiation Oncology | Admitting: Radiation Oncology

## 2018-01-10 DIAGNOSIS — C50411 Malignant neoplasm of upper-outer quadrant of right female breast: Secondary | ICD-10-CM | POA: Diagnosis not present

## 2018-01-11 ENCOUNTER — Ambulatory Visit: Payer: BLUE CROSS/BLUE SHIELD

## 2018-01-11 ENCOUNTER — Ambulatory Visit
Admission: RE | Admit: 2018-01-11 | Discharge: 2018-01-11 | Disposition: A | Discharge status: Home / Self Care | Payer: BLUE CROSS/BLUE SHIELD | Source: Ambulatory Visit | Attending: Radiation Oncology | Admitting: Radiation Oncology

## 2018-01-11 DIAGNOSIS — C50411 Malignant neoplasm of upper-outer quadrant of right female breast: Secondary | ICD-10-CM | POA: Diagnosis not present

## 2018-01-12 ENCOUNTER — Ambulatory Visit
Admission: RE | Admit: 2018-01-12 | Discharge: 2018-01-12 | Disposition: A | Discharge status: Home / Self Care | Payer: BLUE CROSS/BLUE SHIELD | Source: Ambulatory Visit | Attending: Radiation Oncology | Admitting: Radiation Oncology

## 2018-01-12 ENCOUNTER — Inpatient Hospital Stay: Payer: BLUE CROSS/BLUE SHIELD

## 2018-01-12 DIAGNOSIS — C50911 Malignant neoplasm of unspecified site of right female breast: Secondary | ICD-10-CM | POA: Diagnosis not present

## 2018-01-12 DIAGNOSIS — C50411 Malignant neoplasm of upper-outer quadrant of right female breast: Secondary | ICD-10-CM

## 2018-01-12 DIAGNOSIS — Z170 Estrogen receptor positive status [ER+]: Principal | ICD-10-CM

## 2018-01-12 LAB — CBC
HEMATOCRIT: 40.3 % (ref 35.0–47.0)
HEMOGLOBIN: 13.8 g/dL (ref 12.0–16.0)
MCH: 30.5 pg (ref 26.0–34.0)
MCHC: 34.3 g/dL (ref 32.0–36.0)
MCV: 89.1 fL (ref 80.0–100.0)
Platelets: 159 10*3/uL (ref 150–440)
RBC: 4.52 MIL/uL (ref 3.80–5.20)
RDW: 12.9 % (ref 11.5–14.5)
WBC: 3.7 10*3/uL (ref 3.6–11.0)

## 2018-01-13 ENCOUNTER — Ambulatory Visit
Admission: RE | Admit: 2018-01-13 | Discharge: 2018-01-13 | Disposition: A | Discharge status: Home / Self Care | Payer: BLUE CROSS/BLUE SHIELD | Source: Ambulatory Visit | Attending: Radiation Oncology | Admitting: Radiation Oncology

## 2018-01-13 DIAGNOSIS — C50411 Malignant neoplasm of upper-outer quadrant of right female breast: Secondary | ICD-10-CM | POA: Diagnosis not present

## 2018-01-14 ENCOUNTER — Ambulatory Visit
Admission: RE | Admit: 2018-01-14 | Discharge: 2018-01-14 | Disposition: A | Discharge status: Home / Self Care | Payer: BLUE CROSS/BLUE SHIELD | Source: Ambulatory Visit | Attending: Radiation Oncology | Admitting: Radiation Oncology

## 2018-01-14 DIAGNOSIS — C50411 Malignant neoplasm of upper-outer quadrant of right female breast: Secondary | ICD-10-CM | POA: Diagnosis not present

## 2018-01-17 ENCOUNTER — Ambulatory Visit
Admission: RE | Admit: 2018-01-17 | Discharge: 2018-01-17 | Disposition: A | Discharge status: Home / Self Care | Payer: BLUE CROSS/BLUE SHIELD | Source: Ambulatory Visit | Attending: Radiation Oncology | Admitting: Radiation Oncology

## 2018-01-17 DIAGNOSIS — C50411 Malignant neoplasm of upper-outer quadrant of right female breast: Secondary | ICD-10-CM | POA: Diagnosis not present

## 2018-01-18 ENCOUNTER — Ambulatory Visit
Admission: RE | Admit: 2018-01-18 | Discharge: 2018-01-18 | Disposition: A | Discharge status: Home / Self Care | Payer: BLUE CROSS/BLUE SHIELD | Source: Ambulatory Visit | Attending: Radiation Oncology | Admitting: Radiation Oncology

## 2018-01-18 DIAGNOSIS — C50411 Malignant neoplasm of upper-outer quadrant of right female breast: Secondary | ICD-10-CM | POA: Diagnosis not present

## 2018-01-19 ENCOUNTER — Ambulatory Visit
Admission: RE | Admit: 2018-01-19 | Discharge: 2018-01-19 | Disposition: A | Discharge status: Home / Self Care | Payer: BLUE CROSS/BLUE SHIELD | Source: Ambulatory Visit | Attending: Radiation Oncology | Admitting: Radiation Oncology

## 2018-01-19 DIAGNOSIS — C50411 Malignant neoplasm of upper-outer quadrant of right female breast: Secondary | ICD-10-CM | POA: Diagnosis not present

## 2018-01-20 ENCOUNTER — Ambulatory Visit: Payer: BLUE CROSS/BLUE SHIELD

## 2018-01-20 ENCOUNTER — Ambulatory Visit
Admission: RE | Admit: 2018-01-20 | Discharge: 2018-01-20 | Disposition: A | Discharge status: Home / Self Care | Payer: BLUE CROSS/BLUE SHIELD | Source: Ambulatory Visit | Attending: Radiation Oncology | Admitting: Radiation Oncology

## 2018-01-20 DIAGNOSIS — C50411 Malignant neoplasm of upper-outer quadrant of right female breast: Secondary | ICD-10-CM | POA: Diagnosis not present

## 2018-01-21 ENCOUNTER — Ambulatory Visit: Payer: BLUE CROSS/BLUE SHIELD

## 2018-02-23 ENCOUNTER — Other Ambulatory Visit: Payer: Self-pay

## 2018-02-23 ENCOUNTER — Ambulatory Visit
Admission: RE | Admit: 2018-02-23 | Discharge: 2018-02-23 | Disposition: A | Discharge status: Home / Self Care | Payer: BLUE CROSS/BLUE SHIELD | Source: Ambulatory Visit | Attending: Radiation Oncology | Admitting: Radiation Oncology

## 2018-02-23 ENCOUNTER — Encounter: Payer: Self-pay | Admitting: Radiation Oncology

## 2018-02-23 VITALS — BP 126/82 | HR 85 | Temp 98.1°F | Resp 18 | Wt 218.5 lb

## 2018-02-23 DIAGNOSIS — Z170 Estrogen receptor positive status [ER+]: Secondary | ICD-10-CM | POA: Diagnosis not present

## 2018-02-23 DIAGNOSIS — C50411 Malignant neoplasm of upper-outer quadrant of right female breast: Secondary | ICD-10-CM | POA: Diagnosis present

## 2018-02-23 NOTE — Progress Notes (Signed)
Radiation Oncology Follow up Note  Name: Rhonda Schneider   Date:   02/23/2018 MRN:  [id number] DOB: October 02, 1960    This 57 y.o. female presents to the clinic today for one-month follow-up status post whole breast radiation to her right breast for stage I ER/PR positive invasive mammary carcinoma.  REFERRING PROVIDER: Schermerhorn, Gwen Her,*  HPI: patient is a 57 year old female now out 1 month having completed whole breast radiation to her right breast for stage I ER/PR positive HER-2/neu negative invasive mammary carcinoma. Seen today in routine follow-up she is doing well. She specifically denies breast tenderness cough or bone pain..she has started on arimadex is tolerating that well without side effect.  COMPLICATIONS OF TREATMENT: none  FOLLOW UP COMPLIANCE: keeps appointments   PHYSICAL EXAM:  BP 126/82 (BP Location: Left Arm, Patient Position: Sitting)   Pulse 85   Temp 98.1 F (36.7 C) (Tympanic)   Resp 18   Wt 218 lb 7.6 oz (99.1 kg)   BMI 35.26 kg/m  Lungs are clear to A&P cardiac examination essentially unremarkable with regular rate and rhythm. No dominant mass or nodularity is noted in either breast in 2 positions examined. Incision is well-healed. No axillary or supraclavicular adenopathy is appreciated. Cosmetic result is excellent.does have some lymphatic lines draining towards her nipple on the skin surface consistent with radiation change.Nursing instruction has been given. All questions have been answered. Treatments will continue as planned  RADIOLOGY RESULTS: no current films for review  PLAN: the present time patient is doing well 1 month out from whole breast radiation. Still has slight hyperpigmentation the skin although that result cosmetically is excellent. She continues on arimadex without side effect. I've asked to see her back in 4-5 months for follow-up. Patient knows to call with any concerns at any time.  I would like to take this opportunity to thank  you for allowing me to participate in the care of your patient.Noreene Filbert, MD

## 2018-03-11 ENCOUNTER — Inpatient Hospital Stay: Payer: BLUE CROSS/BLUE SHIELD | Admitting: Oncology

## 2018-03-11 ENCOUNTER — Inpatient Hospital Stay: Payer: BLUE CROSS/BLUE SHIELD

## 2018-03-17 ENCOUNTER — Inpatient Hospital Stay: Payer: BLUE CROSS/BLUE SHIELD | Attending: Oncology

## 2018-03-17 ENCOUNTER — Encounter: Payer: Self-pay | Admitting: Oncology

## 2018-03-17 ENCOUNTER — Inpatient Hospital Stay (HOSPITAL_BASED_OUTPATIENT_CLINIC_OR_DEPARTMENT_OTHER): Payer: BLUE CROSS/BLUE SHIELD | Admitting: Oncology

## 2018-03-17 ENCOUNTER — Other Ambulatory Visit: Payer: Self-pay

## 2018-03-17 VITALS — BP 117/78 | HR 105 | Temp 99.7°F | Resp 18 | Ht 66.0 in | Wt 219.1 lb

## 2018-03-17 DIAGNOSIS — Z170 Estrogen receptor positive status [ER+]: Secondary | ICD-10-CM | POA: Diagnosis not present

## 2018-03-17 DIAGNOSIS — Z87891 Personal history of nicotine dependence: Secondary | ICD-10-CM

## 2018-03-17 DIAGNOSIS — Z79811 Long term (current) use of aromatase inhibitors: Secondary | ICD-10-CM | POA: Insufficient documentation

## 2018-03-17 DIAGNOSIS — C50911 Malignant neoplasm of unspecified site of right female breast: Secondary | ICD-10-CM

## 2018-03-17 DIAGNOSIS — Z08 Encounter for follow-up examination after completed treatment for malignant neoplasm: Secondary | ICD-10-CM

## 2018-03-17 DIAGNOSIS — Z79899 Other long term (current) drug therapy: Secondary | ICD-10-CM | POA: Diagnosis not present

## 2018-03-17 DIAGNOSIS — Z5181 Encounter for therapeutic drug level monitoring: Secondary | ICD-10-CM

## 2018-03-17 DIAGNOSIS — Z853 Personal history of malignant neoplasm of breast: Secondary | ICD-10-CM

## 2018-03-17 LAB — COMPREHENSIVE METABOLIC PANEL
ALBUMIN: 4.4 g/dL (ref 3.5–5.0)
ALK PHOS: 87 U/L (ref 38–126)
ALT: 25 U/L (ref 0–44)
AST: 21 U/L (ref 15–41)
Anion gap: 8 (ref 5–15)
BILIRUBIN TOTAL: 0.6 mg/dL (ref 0.3–1.2)
BUN: 13 mg/dL (ref 6–20)
CALCIUM: 9.7 mg/dL (ref 8.9–10.3)
CO2: 29 mmol/L (ref 22–32)
Chloride: 104 mmol/L (ref 98–111)
Creatinine, Ser: 0.92 mg/dL (ref 0.44–1.00)
GFR calc Af Amer: >60 mL/min (ref >60)
GFR calc non Af Amer: >60 mL/min (ref >60)
GLUCOSE: 144 mg/dL — AB (ref 70–99)
Potassium: 3.8 mmol/L (ref 3.5–5.1)
Sodium: 141 mmol/L (ref 135–145)
TOTAL PROTEIN: 7.1 g/dL (ref 6.5–8.1)

## 2018-03-17 NOTE — Progress Notes (Signed)
Patient c/o stuffy nose

## 2018-03-18 NOTE — Progress Notes (Signed)
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 She recently had a screening bilateral mammogram which showed left breast cysts and architectural distortion in her right breast warranting a biopsy. No evidence of abnormal axillary adenopathy noted on scans. 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 Size of invasive carcinoma was 1.7 mm, grade 1. Lymphovascular invasion not identified. ER PR positive and HER-2/neu negative.   Patient has seen Dr. Peyton Najjar and is scheduled to undergolumpectomy and sentinel lymph node biopsy next week.  Patient's family history significant for her mother who had breast cancer in her 58s. Patient herself has not had any prior abnormal mammograms or breast biopsies. She is currently postmenopausal and has not had any menstrual cycles for about 3 years. She used birth control remotely in the 23s. Currently she is not on any hormone replacement therapy. She is G2, P2 L2.  Final pathology showed 7 mm invasive mammary carcinoma, grade 1 with negative margins.  Sentinel lymph nodes were negative.  pT1b pN0 ER PR positive HER-2/neu negative  Given that tumor was grade 1 and <10 mm, she did not require adjuvant chemotherapy or oncotype testing H.  She completed adjuvant radiation and started Arimidex in October 2019  Interval history-patient is tolerating Arimidex well so far and reports no significant side effects.  Reports  no fatigue, mood swings, arthralgias or hot flashes.  She is also taking her calcium and vitamin D  ECOG PS- 0 Pain scale- 0 Opioid associated constipation- no  Review of systems- Review of Systems  Constitutional: Negative for chills, fever, malaise/fatigue and weight loss.  HENT: Negative for congestion, ear discharge and nosebleeds.   Eyes: Negative for blurred vision.  Respiratory: Negative for cough, hemoptysis, sputum production, shortness of breath and wheezing.   Cardiovascular: Negative for chest pain, palpitations, orthopnea and claudication.  Gastrointestinal: Negative for abdominal pain, blood in stool, constipation, diarrhea, heartburn, melena, nausea and vomiting.  Genitourinary: Negative for dysuria, flank pain, frequency, hematuria and urgency.  Musculoskeletal: Negative for back pain, joint pain and myalgias.  Skin: Negative for rash.  Neurological: Negative for dizziness, tingling, focal weakness, seizures, weakness and headaches.  Endo/Heme/Allergies: Does not bruise/bleed easily.  Psychiatric/Behavioral: Negative for depression and suicidal ideas. The patient does not have insomnia.       No Known Allergies   Past Medical History:  Diagnosis Date  . Bladder tumor   . Breast cancer ([place])   . Cancer ([place]) 2018   kidney  . History of kidney stones    H/O     Past Surgical History:  Procedure Laterality Date  . APPENDECTOMY  1978   ruptured  . BREAST BIOPSY Right 10/12/2017   stereotactic biopsy - distortion - x clip  . BREAST LUMPECTOMY Right 11/01/2017   lumpectomy low-grade invasive carcinoma, ductal carcinoma in situ  . CESAREAN SECTION  1987  . CYSTOSCOPY W/ URETERAL STENT PLACEMENT Right 11/13/2015   Procedure: CYSTOSCOPY WITH STENT REPLACEMENT;  Surgeon: Nickie Retort, MD;  Location: [facility];  Service: Urology;  Laterality: Right;  . CYSTOSCOPY WITH STENT PLACEMENT Right 10/31/2015   Procedure: CYSTOSCOPY WITH STENT PLACEMENT;  Surgeon: Nickie Retort, MD;  Location: [facility];  Service: Urology;  Laterality: Right;  . KIDNEY CYST REMOVAL     2018  . PARTIAL MASTECTOMY WITH NEEDLE LOCALIZATION Right 11/01/2017   Procedure: PARTIAL MASTECTOMY WITH NEEDLE LOCALIZATION;  Surgeon: Herbert Pun, MD;  Location: [facility];  Service: General;  Laterality: Right;  . SENTINEL NODE BIOPSY Right 11/01/2017   Procedure: SENTINEL NODE BIOPSY;  Surgeon: Herbert Pun, MD;  Location: [facility];  Service: General;  Laterality: Right;  . TRANSURETHRAL RESECTION OF BLADDER TUMOR N/A 11/13/2015   Procedure: TRANSURETHRAL RESECTION OF BLADDER TUMOR (TURBT);  Surgeon: Nickie Retort, MD;  Location: [facility];  Service: Urology;  Laterality: N/A;  . TUBAL LIGATION    . URETEROSCOPY WITH HOLMIUM LASER LITHOTRIPSY Right 11/13/2015   Procedure: URETEROSCOPY WITH HOLMIUM LASER LITHOTRIPSY;  Surgeon: Nickie Retort, MD;  Location: [facility];  Service: Urology;  Laterality: Right;    Social History   Socioeconomic History  . Marital status: Married    Spouse name: Not on file  . Number of children: Not on file  . Years of education: Not on file  . Highest education level: Not on file  Occupational History  . Not on file  Social Needs  . Financial resource strain: Not on file  . Food insecurity:    Worry: Not on file    Inability: Not on file  . Transportation needs:    Medical: Not on file    Non-medical: Not on file  Tobacco Use  . Smoking status: Former Smoker    Packs/day: 1.00    Years: 20.00    Pack years: 20.00    Types: Cigarettes    Last attempt to quit: 10/27/2006    Years since quitting: 11.3  . Smokeless tobacco: Never Used  Substance and Sexual Activity  . Alcohol use: No  . Drug use: No  . Sexual activity: Yes  Lifestyle  . Physical activity:    Days per week: Not on file    Minutes per session: Not on file  . Stress: Not on file  Relationships  . Social connections:    Talks on phone: Not on file     Gets together: Not on file    Attends religious service: Not on file    Active member of club or organization: Not on file    Attends meetings of clubs or organizations: Not on file    Relationship status: Not on file  . Intimate partner violence:    Fear of current or ex partner: Not on file    Emotionally abused: Not on file    Physically abused: Not on file    Forced sexual activity: Not on file  Other Topics Concern  . Not on file  Social History Narrative  . Not on file    Family History  Problem Relation Age of Onset  . Heart attack Father   . Cancer Mother 42       breast  . Breast cancer Mother 41  . Coronary artery disease Mother 70       "open heart surgery"  . Heart attack Mother   . Developmental delay Sister        lves at [facility] center -in 46's  . Kidney disease Neg Hx      Current Outpatient Medications:  .  anastrozole (ARIMIDEX) 1 MG  tablet, Take 1 tablet (1 mg total) by mouth daily., Disp: 30 tablet, Rfl: 3 .  Multiple Vitamin (MULTIVITAMIN) tablet, Take 1 tablet by mouth daily., Disp: , Rfl:  .  simvastatin (ZOCOR) 20 MG tablet, Take 20 mg by mouth at bedtime. , Disp: , Rfl:  .  phentermine (ADIPEX-P) 37.5 MG tablet, Take 18.75 mg by mouth daily before breakfast. , Disp: , Rfl:   Physical exam:  Vitals:   03/17/18 1401  BP: 117/78  Pulse: (!) 105  Resp: 18  Temp: 99.7 F (37.6 C)  TempSrc: Tympanic  SpO2: 97%  Weight: 219 lb 1.6 oz (99.4 kg)  Height: _0  (1.676 m)   Physical Exam  Constitutional: She is oriented to person, place, and time. She appears well-developed and well-nourished.  HENT:  Head: Normocephalic and atraumatic.  Eyes: Pupils are equal, round, and reactive to light. EOM are normal.  Neck: Normal range of motion.  Cardiovascular: Normal rate, regular rhythm and normal heart sounds.  Pulmonary/Chest: Effort normal and breath sounds normal.  Abdominal: Soft. Bowel sounds are normal.  Neurological: She is alert and oriented  to person, place, and time.  Skin: Skin is warm and dry.     CMP Latest Ref Rng & Units 03/17/2018  Glucose 70 - 99 mg/dL 144(H)  BUN 6 - 20 mg/dL 13  Creatinine 0.44 - 1.00 mg/dL 0.92  Sodium 135 - 145 mmol/L 141  Potassium 3.5 - 5.1 mmol/L 3.8  Chloride 98 - 111 mmol/L 104  CO2 22 - 32 mmol/L 29  Calcium 8.9 - 10.3 mg/dL 9.7  Total Protein 6.5 - 8.1 g/dL 7.1  Total Bilirubin 0.3 - 1.2 mg/dL 0.6  Alkaline Phos 38 - 126 U/L 87  AST 15 - 41 U/L 21  ALT 0 - 44 U/L 25   CBC Latest Ref Rng & Units 01/12/2018  WBC 3.6 - 11.0 K/uL 3.7  Hemoglobin 12.0 - 16.0 g/dL 13.8  Hematocrit 35.0 - 47.0 % 40.3  Platelets 150 - 440 K/uL 159    Assessment and plan- Patient is a 57 y.o. female with invasive mammary carcinoma of the right breast stage IA pT1b pN0 M0 ER PR positive HER-2/neu negative status post lumpectomy and adjuvant radiation and currently on Arimidex  Patient has been on Arimidex for about 6 weeks now along with calcium and vitamin D.  She is tolerating it well without any significant side effects.  I recommend that she should continue this for 5 more years.  I will see her back in 3 months time for routine history and physical.  Patient reports that she is changing her insurance starting next year and she is not sure if [facility] will be in her network.  We will reach out to [place] next year and we will look into her insurance from our side as well.  If she is unable to follow-up with [place] if she will need to be set up to be seen at [facility] or [place] which is what she says would be within her network   Visit Diagnosis 1. Visit for monitoring Arimidex therapy   2. Encounter for follow-up surveillance of breast cancer      Dr. Randa Evens, MD, MPH [facility] at [facility] [phone] 03/18/2018 10:57 AM

## 2018-03-23 ENCOUNTER — Encounter: Payer: Self-pay | Admitting: Oncology

## 2018-04-18 ENCOUNTER — Other Ambulatory Visit: Payer: Self-pay | Admitting: *Deleted

## 2018-04-18 MED ORDER — ANASTROZOLE 1 MG PO TABS: 1.0000 mg | ORAL_TABLET | Freq: Every day | ORAL | 0 refills | Status: DC

## 2018-06-13 ENCOUNTER — Other Ambulatory Visit: Payer: Self-pay

## 2018-06-13 ENCOUNTER — Inpatient Hospital Stay: Payer: BLUE CROSS/BLUE SHIELD | Attending: Oncology | Admitting: Oncology

## 2018-06-13 VITALS — BP 113/74 | HR 97 | Temp 98.3°F | Resp 18 | Wt 223.0 lb

## 2018-06-13 DIAGNOSIS — Z79899 Other long term (current) drug therapy: Secondary | ICD-10-CM | POA: Insufficient documentation

## 2018-06-13 DIAGNOSIS — Z170 Estrogen receptor positive status [ER+]: Secondary | ICD-10-CM | POA: Diagnosis not present

## 2018-06-13 DIAGNOSIS — Z87891 Personal history of nicotine dependence: Secondary | ICD-10-CM | POA: Diagnosis not present

## 2018-06-13 DIAGNOSIS — Z79811 Long term (current) use of aromatase inhibitors: Secondary | ICD-10-CM | POA: Diagnosis not present

## 2018-06-13 DIAGNOSIS — C50911 Malignant neoplasm of unspecified site of right female breast: Secondary | ICD-10-CM | POA: Insufficient documentation

## 2018-06-13 DIAGNOSIS — Z923 Personal history of irradiation: Secondary | ICD-10-CM | POA: Diagnosis not present

## 2018-06-13 DIAGNOSIS — Z5181 Encounter for therapeutic drug level monitoring: Secondary | ICD-10-CM

## 2018-06-13 DIAGNOSIS — Z08 Encounter for follow-up examination after completed treatment for malignant neoplasm: Secondary | ICD-10-CM

## 2018-06-13 DIAGNOSIS — Z853 Personal history of malignant neoplasm of breast: Secondary | ICD-10-CM

## 2018-06-13 NOTE — Progress Notes (Signed)
Here for follow up . Per pt " doing good " no c/o

## 2018-06-13 NOTE — Progress Notes (Signed)
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 She recently had a screening bilateral mammogram which showed left breast cysts and architectural distortion in her right breast warranting a biopsy. No evidence of abnormal axillary adenopathy noted on scans. 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 Size of invasive carcinoma was 1.7 mm, grade 1. Lymphovascular invasion not identified. ER PR positive and HER-2/neu negative.   Patient has seen Dr. Peyton Najjar and is scheduled to undergolumpectomy and sentinel lymph node biopsy next week.  Patient's family history significant for her mother who had breast cancer in her 63s. Patient herself has not had any prior abnormal mammograms or breast biopsies. She is currently postmenopausal and has not had any menstrual cycles for about 3 years. She used birth control remotely in the 57s. Currently she is not on any hormone replacement therapy. She is G2, P2 L2.  Final pathology showed 7 mm invasive mammary carcinoma, grade 1 with negative margins. Sentinel lymph nodes were negative. pT1b pN0 ER PR positive HER-2/neu negative  Given that tumor was grade 1 and <10 mm, she did not require adjuvant chemotherapy or oncotype testing H.  She completed adjuvant radiation and started Arimidex in October 2019  Interval history-doing well on Arimidex and denies any significant complaints such as fatigue, arthralgias  or hot flashes.  Her appetite is good and she denies any unintentional weight loss  ECOG PS- 0 Pain scale- 0   Review of systems- Review of Systems  Constitutional: Negative for chills, fever, malaise/fatigue and weight loss.  HENT: Negative for congestion, ear discharge and nosebleeds.   Eyes: Negative for blurred vision.  Respiratory: Negative for cough, hemoptysis, sputum production, shortness of breath and wheezing.   Cardiovascular: Negative for chest pain, palpitations, orthopnea and claudication.  Gastrointestinal: Negative for abdominal pain, blood in stool, constipation, diarrhea, heartburn, melena, nausea and vomiting.  Genitourinary: Negative for dysuria, flank pain, frequency, hematuria and urgency.  Musculoskeletal: Negative for back pain, joint pain and myalgias.  Skin: Negative for rash.  Neurological: Negative for dizziness, tingling, focal weakness, seizures, weakness and headaches.  Endo/Heme/Allergies: Does not bruise/bleed easily.  Psychiatric/Behavioral: Negative for depression and suicidal ideas. The patient does not have insomnia.       No Known Allergies   Past Medical History:  Diagnosis Date  . Bladder tumor   . Breast cancer ([place])   . Cancer ([place]) 2018   kidney  . History of kidney stones    H/O     Past Surgical History:  Procedure Laterality Date  . APPENDECTOMY  1978   ruptured  . BREAST BIOPSY Right 10/12/2017   stereotactic biopsy - distortion - x clip  . BREAST LUMPECTOMY Right 11/01/2017   lumpectomy low-grade invasive carcinoma, ductal carcinoma in situ  . CESAREAN SECTION  1987  . CYSTOSCOPY W/ URETERAL STENT PLACEMENT Right 11/13/2015   Procedure: CYSTOSCOPY WITH STENT REPLACEMENT;  Surgeon: Nickie Retort, MD;  Location: [facility];  Service: Urology;  Laterality: Right;  . [place]  Right 10/31/2015   Procedure: CYSTOSCOPY WITH STENT PLACEMENT;  Surgeon: Nickie Retort, MD;  Location: [facility];  Service:  Urology;  Laterality: Right;  . KIDNEY CYST REMOVAL     2018  . PARTIAL MASTECTOMY WITH NEEDLE LOCALIZATION Right 11/01/2017   Procedure: PARTIAL MASTECTOMY WITH NEEDLE LOCALIZATION;  Surgeon: Herbert Pun, MD;  Location: [facility];  Service: General;  Laterality: Right;  . SENTINEL NODE BIOPSY Right 11/01/2017   Procedure: SENTINEL NODE BIOPSY;  Surgeon: Herbert Pun, MD;  Location: [facility];  Service: General;  Laterality: Right;  . TRANSURETHRAL RESECTION OF BLADDER TUMOR N/A 11/13/2015   Procedure: TRANSURETHRAL RESECTION OF BLADDER TUMOR (TURBT);  Surgeon: Nickie Retort, MD;  Location: [facility];  Service: Urology;  Laterality: N/A;  . TUBAL LIGATION    . URETEROSCOPY WITH HOLMIUM LASER LITHOTRIPSY Right 11/13/2015   Procedure: URETEROSCOPY WITH HOLMIUM LASER LITHOTRIPSY;  Surgeon: Nickie Retort, MD;  Location: [facility];  Service: Urology;  Laterality: Right;    Social History   Socioeconomic History  . Marital status: Married    Spouse name: Not on file  . Number of children: Not on file  . Years of education: Not on file  . Highest education level: Not on file  Occupational History  . Not on file  Social Needs  . Financial resource strain: Not on file  . Food insecurity:    Worry: Not on file    Inability: Not on file  . Transportation needs:    Medical: Not on file    Non-medical: Not on file  Tobacco Use  . Smoking status: Former Smoker    Packs/day: 1.00    Years: 20.00    Pack years: 20.00    Types: Cigarettes    Last attempt to quit: 10/27/2006    Years since quitting: 11.6  . Smokeless tobacco: Never Used  Substance and Sexual Activity  . Alcohol use: No  . Drug use: No  . Sexual activity: Yes  Lifestyle  . Physical activity:    Days per week: Not on file    Minutes per session: Not on file  . Stress: Not on file  Relationships  . Social connections:    Talks on phone: Not on file    Gets together: Not on file    Attends religious  service: Not on file    Active member of club or organization: Not on file    Attends meetings of clubs or organizations: Not on file    Relationship status: Not on file  . Intimate partner violence:    Fear of current or ex partner: Not on file    Emotionally abused: Not on file    Physically abused: Not on file    Forced sexual activity: Not on file  Other Topics Concern  . Not on file  Social History Narrative  . Not on file    Family History  Problem Relation Age of Onset  . Heart attack Father   . Cancer Mother 77       breast  . Breast cancer Mother 60  . Coronary artery disease Mother 61       "open heart surgery"  . Heart attack Mother   . Developmental delay Sister        lves at [facility] center -in 36's  . Kidney disease Neg Hx      Current Outpatient Medications:  .  anastrozole (ARIMIDEX) 1 MG tablet, Take 1 tablet (1 mg total) by  mouth daily., Disp: 90 tablet, Rfl: 0 .  Multiple Vitamin (MULTIVITAMIN) tablet, Take 1 tablet by mouth daily., Disp: , Rfl:  .  simvastatin (ZOCOR) 20 MG tablet, Take 20 mg by mouth at bedtime. , Disp: , Rfl:   Physical exam:  Vitals:   06/13/18 0847  BP: 113/74  Pulse: 97  Resp: 18  Temp: 98.3 F (36.8 C)  TempSrc: Tympanic  Weight: 223 lb (101.2 kg)   Physical Exam Constitutional:      General: She is not in acute distress. HENT:     Head: Normocephalic and atraumatic.  Eyes:     Pupils: Pupils are equal, round, and reactive to light.  Neck:     Musculoskeletal: Normal range of motion.  Cardiovascular:     Rate and Rhythm: Normal rate and regular rhythm.     Heart sounds: Normal heart sounds.  Pulmonary:     Effort: Pulmonary effort is normal.     Breath sounds: Normal breath sounds.  Abdominal:     General: Bowel sounds are normal.     Palpations: Abdomen is soft.  Skin:    General: Skin is warm and dry.  Neurological:     Mental Status: She is alert and oriented to person, place, and time.    Breast exam  was performed in seated and lying down position. Patient is status post right lumpectomy with a well-healed surgical scar. No evidence of any palpable masses. No evidence of axillary adenopathy. No evidence of any palpable masses or lumps in the left breast. No evidence of leftt axillary adenopathy   CMP Latest Ref Rng & Units 03/17/2018  Glucose 70 - 99 mg/dL 144(H)  BUN 6 - 20 mg/dL 13  Creatinine 0.44 - 1.00 mg/dL 0.92  Sodium 135 - 145 mmol/L 141  Potassium 3.5 - 5.1 mmol/L 3.8  Chloride 98 - 111 mmol/L 104  CO2 22 - 32 mmol/L 29  Calcium 8.9 - 10.3 mg/dL 9.7  Total Protein 6.5 - 8.1 g/dL 7.1  Total Bilirubin 0.3 - 1.2 mg/dL 0.6  Alkaline Phos 38 - 126 U/L 87  AST 15 - 41 U/L 21  ALT 0 - 44 U/L 25   CBC Latest Ref Rng & Units 01/12/2018  WBC 3.6 - 11.0 K/uL 3.7  Hemoglobin 12.0 - 16.0 g/dL 13.8  Hematocrit 35.0 - 47.0 % 40.3  Platelets 150 - 440 K/uL 159      Assessment and plan- Patient is a 58 y.o. female with invasive mammary carcinoma of the right breast stage IA pT1b pN0 M0 ER PR positive HER-2/neu negative status post lumpectomy and adjuvant radiation and currently on Arimidex.  She is here for routine follow-up of breast cancer on Arimidex  Clinically patient is doing well and there is no evidence of recurrence on today's exam.  She is tolerating her Arimidex along with calcium and vitamin D well without any significant side effects.  She is in the process of figuring it out if Dr. Ouida Sills and Dr. Windell Moment will be within her network to seek subsequent care.  I will therefore go ahead and schedule a mammogram for her in June 2020.  I will see her back in 3 months no labs.   Visit Diagnosis 1. Encounter for follow-up surveillance of breast cancer   2. Visit for monitoring Arimidex therapy      Dr. Randa Evens, MD, MPH [facility] at [facility] [phone] 06/13/2018 9:24 AM

## 2018-06-14 ENCOUNTER — Encounter: Payer: Self-pay | Admitting: Oncology

## 2018-07-29 ENCOUNTER — Other Ambulatory Visit: Payer: Self-pay | Admitting: Oncology

## 2018-08-22 ENCOUNTER — Encounter: Payer: Self-pay | Admitting: *Deleted

## 2018-08-31 ENCOUNTER — Ambulatory Visit: Payer: BLUE CROSS/BLUE SHIELD | Admitting: Radiation Oncology

## 2018-09-14 ENCOUNTER — Other Ambulatory Visit: Payer: Self-pay

## 2018-09-15 ENCOUNTER — Other Ambulatory Visit: Payer: Self-pay

## 2018-09-15 ENCOUNTER — Encounter: Payer: Self-pay | Admitting: Oncology

## 2018-09-15 ENCOUNTER — Inpatient Hospital Stay: Payer: BLUE CROSS/BLUE SHIELD | Attending: Oncology | Admitting: Oncology

## 2018-09-15 DIAGNOSIS — Z853 Personal history of malignant neoplasm of breast: Secondary | ICD-10-CM

## 2018-09-15 DIAGNOSIS — Z5181 Encounter for therapeutic drug level monitoring: Secondary | ICD-10-CM

## 2018-09-15 DIAGNOSIS — Z87891 Personal history of nicotine dependence: Secondary | ICD-10-CM

## 2018-09-15 DIAGNOSIS — Z170 Estrogen receptor positive status [ER+]: Secondary | ICD-10-CM | POA: Diagnosis not present

## 2018-09-15 DIAGNOSIS — Z08 Encounter for follow-up examination after completed treatment for malignant neoplasm: Secondary | ICD-10-CM

## 2018-09-15 DIAGNOSIS — Z9011 Acquired absence of right breast and nipple: Secondary | ICD-10-CM | POA: Diagnosis not present

## 2018-09-15 DIAGNOSIS — Z79899 Other long term (current) drug therapy: Secondary | ICD-10-CM

## 2018-09-15 DIAGNOSIS — C50911 Malignant neoplasm of unspecified site of right female breast: Secondary | ICD-10-CM

## 2018-09-15 DIAGNOSIS — Z79811 Long term (current) use of aromatase inhibitors: Secondary | ICD-10-CM | POA: Diagnosis not present

## 2018-09-15 NOTE — Progress Notes (Signed)
Breast cancer no concerns. She read me the bottle of calcium 600 mg and vit d 20 mcg.

## 2018-09-16 NOTE — Progress Notes (Signed)
I connected with Rhonda Schneider on 09/16/18 at 10:45 AM EDT by video enabled telemedicine visit and verified that I am speaking with the correct person using two identifiers.   I discussed the limitations, risks, security and privacy concerns of performing an evaluation and management service by telemedicine and the availability of in-person appointments. I also discussed with the patient that there may be a patient responsible charge related to this service. The patient expressed understanding and agreed to proceed.  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 She recently had a screening bilateral mammogram which showed left breast cysts and architectural distortion in her right breast warranting a biopsy. No evidence of abnormal axillary adenopathy noted on scans. 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 Size of invasive carcinoma was 1.7 mm, grade 1. Lymphovascular invasion not identified. ER PR positive and HER-2/neu negative.   Patient has seen Dr. Peyton Najjar and is scheduled to undergolumpectomy and sentinel lymph node biopsy next week.  Patient's family history significant for her mother who had breast cancer in her 37s. Patient herself has not had any prior abnormal mammograms or breast biopsies. She is currently postmenopausal and has not had any menstrual cycles for about 3 years. She used birth control remotely in the 82s. Currently she is not on any hormone replacement therapy. She is G2, P2 L2.  Final pathology showed 7 mm invasive mammary carcinoma, grade 1 with negative margins. Sentinel lymph  nodes were negative. pT1b pN0 ER PR positive HER-2/neu negative  Given that tumor was grade 1 and <10 mm, she did not require adjuvant chemotherapy or oncotype testingH. She completed adjuvant radiation and started Arimidex in October 2019   Interval history: Patient is tolerating her Arimidex well and reports no significant joint pains, fatigue or hot flashes.  She is on 600 mg of calcium and thousand international units of vitamin D twice a day   Review of Systems  Constitutional: Negative for chills, fever, malaise/fatigue and weight loss.  HENT: Negative for congestion, ear discharge and nosebleeds.   Eyes: Negative for blurred vision.  Respiratory: Negative for cough, hemoptysis, sputum production, shortness of breath and wheezing.   Cardiovascular: Negative for chest pain, palpitations, orthopnea and claudication.  Gastrointestinal: Negative for abdominal pain, blood in stool, constipation, diarrhea, heartburn, melena, nausea and vomiting.  Genitourinary: Negative for dysuria, flank pain, frequency, hematuria and urgency.  Musculoskeletal: Negative for back pain, joint pain and myalgias.  Skin: Negative for rash.  Neurological: Negative for dizziness, tingling, focal weakness, seizures, weakness and headaches.  Endo/Heme/Allergies: Does not bruise/bleed easily.  Psychiatric/Behavioral: Negative for depression and suicidal ideas. The patient does not have insomnia.     No Known Allergies  Past Medical History:  Diagnosis Date  . Bladder tumor   . Breast cancer ([place])   . Cancer ([place]) 2018   kidney  . History of kidney stones    H/O    Past Surgical History:  Procedure Laterality Date  . APPENDECTOMY  1978   ruptured  . BREAST BIOPSY Right 10/12/2017   stereotactic biopsy - distortion - x clip  . BREAST LUMPECTOMY Right 11/01/2017   lumpectomy low-grade invasive  carcinoma, ductal carcinoma in situ  . CESAREAN SECTION  1987  . CYSTOSCOPY W/ URETERAL STENT PLACEMENT  Right 11/13/2015   Procedure: CYSTOSCOPY WITH STENT REPLACEMENT;  Surgeon: Nickie Retort, MD;  Location: [facility];  Service: Urology;  Laterality: Right;  . CYSTOSCOPY WITH STENT PLACEMENT Right 10/31/2015   Procedure: CYSTOSCOPY WITH STENT PLACEMENT;  Surgeon: Nickie Retort, MD;  Location: [facility];  Service: Urology;  Laterality: Right;  . KIDNEY CYST REMOVAL     2018  . PARTIAL MASTECTOMY WITH NEEDLE LOCALIZATION Right 11/01/2017   Procedure: PARTIAL MASTECTOMY WITH NEEDLE LOCALIZATION;  Surgeon: Herbert Pun, MD;  Location: [facility];  Service: General;  Laterality: Right;  . SENTINEL NODE BIOPSY Right 11/01/2017   Procedure: SENTINEL NODE BIOPSY;  Surgeon: Herbert Pun, MD;  Location: [facility];  Service: General;  Laterality: Right;  . TRANSURETHRAL RESECTION OF BLADDER TUMOR N/A 11/13/2015   Procedure: TRANSURETHRAL RESECTION OF BLADDER TUMOR (TURBT);  Surgeon: Nickie Retort, MD;  Location: [facility];  Service: Urology;  Laterality: N/A;  . TUBAL LIGATION    . URETEROSCOPY WITH HOLMIUM LASER LITHOTRIPSY Right 11/13/2015   Procedure: URETEROSCOPY WITH HOLMIUM LASER LITHOTRIPSY;  Surgeon: Nickie Retort, MD;  Location: [facility];  Service: Urology;  Laterality: Right;    Social History   Socioeconomic History  . Marital status: Married    Spouse name: Not on file  . Number of children: Not on file  . Years of education: Not on file  . Highest education level: Not on file  Occupational History  . Not on file  Social Needs  . Financial resource strain: Not on file  . Food insecurity:    Worry: Not on file    Inability: Not on file  . Transportation needs:    Medical: Not on file    Non-medical: Not on file  Tobacco Use  . Smoking status: Former Smoker    Packs/day: 1.00    Years: 20.00    Pack years: 20.00    Types: Cigarettes    Last attempt to quit: 10/27/2006    Years since quitting: 11.8  . Smokeless tobacco: Never Used  Substance and Sexual  Activity  . Alcohol use: No  . Drug use: No  . Sexual activity: Yes  Lifestyle  . Physical activity:    Days per week: Not on file    Minutes per session: Not on file  . Stress: Not on file  Relationships  . Social connections:    Talks on phone: Not on file    Gets together: Not on file    Attends religious service: Not on file    Active member of club or organization: Not on file    Attends meetings of clubs or organizations: Not on file    Relationship status: Not on file  . Intimate partner violence:    Fear of current or ex partner: Not on file    Emotionally abused: Not on file    Physically abused: Not on file    Forced sexual activity: Not on file  Other Topics Concern  . Not on file  Social History Narrative  . Not on file    Family History  Problem Relation Age of Onset  . Heart attack Father   . Cancer Mother 23       breast  . Breast cancer Mother 42  . Coronary artery disease Mother 6       "open heart surgery"  . Heart attack  Mother   . Developmental delay Sister        lves at [facility] center -in 74's  . Kidney disease Neg Hx      Current Outpatient Medications:  .  anastrozole (ARIMIDEX) 1 MG tablet, TAKE 1 TABLET(1 MG) BY MOUTH DAILY, Disp: 90 tablet, Rfl: 0 .  Biotin 1000 MCG tablet, Take 1,000 mcg by mouth 3 (three) times daily., Disp: , Rfl:  .  Calcium Carbonate-Vitamin D (CALCIUM 600+D PO), Take 1 tablet by mouth 2 (two) times a day., Disp: , Rfl:  .  Multiple Vitamin (MULTIVITAMIN) tablet, Take 1 tablet by mouth daily., Disp: , Rfl:  .  simvastatin (ZOCOR) 20 MG tablet, Take 20 mg by mouth at bedtime. , Disp: , Rfl:  .  Turmeric 500 MG CAPS, Take 1 tablet by mouth daily., Disp: , Rfl:      CMP Latest Ref Rng & Units 03/17/2018  Glucose 70 - 99 mg/dL 144(H)  BUN 6 - 20 mg/dL 13  Creatinine 0.44 - 1.00 mg/dL 0.92  Sodium 135 - 145 mmol/L 141  Potassium 3.5 - 5.1 mmol/L 3.8  Chloride 98 - 111 mmol/L 104  CO2 22 - 32 mmol/L 29  Calcium  8.9 - 10.3 mg/dL 9.7  Total Protein 6.5 - 8.1 g/dL 7.1  Total Bilirubin 0.3 - 1.2 mg/dL 0.6  Alkaline Phos 38 - 126 U/L 87  AST 15 - 41 U/L 21  ALT 0 - 44 U/L 25   CBC Latest Ref Rng & Units 01/12/2018  WBC 3.6 - 11.0 K/uL 3.7  Hemoglobin 12.0 - 16.0 g/dL 13.8  Hematocrit 35.0 - 47.0 % 40.3  Platelets 150 - 440 K/uL 159     Observation/objective: Patient appears in no acute distress over her video visit today.  Breathing is nonlabored   Assessment and plan: Patient is a 58 year old female with a history of stage Ib invasive mammary carcinoma of the right breast pT1b pN0 cM0 ER PR positive HER-2/neu negative status post lumpectomy and adjuvant radiation therapy.  She is currently on Arimidex and this is a routine follow-up for breast cancer.  Clinically patient is doing well and there are no suspicious symptoms cancer recurrence.  She is tolerating Arimidex along with calcium and vitamin D well.  She will continue to take that for 5 years.  She is scheduled to get an mammogram and ultrasound on 10/03/2018.  Follow-up instructions: I will see her back in 3 months time no labs.  I discussed the assessment and treatment plan with the patient. The patient was provided an opportunity to ask questions and all were answered. The patient agreed with the plan and demonstrated an understanding of the instructions.   The patient was advised to call back or seek an in-person evaluation if the symptoms worsen or if the condition fails to improve as anticipated.   Visit Diagnosis: 1. Encounter for follow-up surveillance of breast cancer   2. Visit for monitoring Arimidex therapy     Dr. Randa Evens, MD, MPH [facility] at [facility] [phone] 09/16/2018 7:11 PM

## 2018-10-03 ENCOUNTER — Other Ambulatory Visit: Payer: Self-pay

## 2018-10-03 ENCOUNTER — Ambulatory Visit
Admission: RE | Admit: 2018-10-03 | Discharge: 2018-10-03 | Disposition: A | Discharge status: Home / Self Care | Payer: BLUE CROSS/BLUE SHIELD | Source: Ambulatory Visit | Attending: Oncology | Admitting: Oncology

## 2018-10-03 DIAGNOSIS — Z08 Encounter for follow-up examination after completed treatment for malignant neoplasm: Secondary | ICD-10-CM

## 2018-10-03 DIAGNOSIS — Z853 Personal history of malignant neoplasm of breast: Secondary | ICD-10-CM | POA: Diagnosis present

## 2018-10-11 ENCOUNTER — Encounter: Payer: Self-pay | Admitting: Urology

## 2018-10-11 ENCOUNTER — Ambulatory Visit (INDEPENDENT_AMBULATORY_CARE_PROVIDER_SITE_OTHER): Payer: BLUE CROSS/BLUE SHIELD | Admitting: Urology

## 2018-10-11 ENCOUNTER — Other Ambulatory Visit: Payer: Self-pay

## 2018-10-11 VITALS — BP 130/81 | HR 88 | Ht 66.0 in | Wt 226.0 lb

## 2018-10-11 DIAGNOSIS — Z8551 Personal history of malignant neoplasm of bladder: Secondary | ICD-10-CM

## 2018-10-11 NOTE — Progress Notes (Signed)
   10/11/18  CC:  Chief Complaint  Patient presents with  . Cysto    HPI: 58 yo F with history of incidental Lg pTA <3 cm 10/2015 without recurrence who returns today for routine annual surveillance cytoscopy.    No voiding symptoms or gross hematuria.    Former smoker.  Blood pressure 130/81, pulse 88, height 5\' 6"  (1.676 m), weight 226 lb (102.5 kg). NED. A&Ox3.   No respiratory distress   Abd soft, NT, ND Normal external genitalia with patent urethral meatus  Cystoscopy Procedure Note  Patient identification was confirmed, informed consent was obtained, and patient was prepped using Betadine solution.  Lidocaine jelly was administered per urethral meatus.    Preoperative abx where received prior to procedure.    Procedure: - Flexible cystoscope introduced, without any difficulty.   - Thorough search of the bladder revealed:    normal urethral meatus    normal urothelium    no stones    no ulcers     no tumors    no urethral polyps    no trabeculation  Small <1 cm submucosal impression at dome of bladder, ? Cyst vs. Bowel.  - Ureteral orifices were normal in position and appearance.  Post-Procedure: - Patient tolerated the procedure well  Assessment/ Plan:  1. History of bladder cancer NED today Continue annual cystoscopy per guidelines Return sooner as needed - Urinalysis, Complete - ciprofloxacin (CIPRO) tablet 500 mg - lidocaine (XYLOCAINE) 2 % jelly 1 application  Return in about 1 year (around 10/11/2019) for cysto.  Hollice Espy, MD

## 2018-10-12 LAB — URINALYSIS, COMPLETE
Bilirubin, UA: NEGATIVE
Glucose, UA: NEGATIVE
Ketones, UA: NEGATIVE
Leukocytes,UA: NEGATIVE
Nitrite, UA: NEGATIVE
Protein,UA: NEGATIVE
Specific Gravity, UA: 1.015 (ref 1.005–1.030)
Urobilinogen, Ur: 0.2 mg/dL (ref 0.2–1.0)
pH, UA: 7 (ref 5.0–7.5)

## 2018-10-12 LAB — MICROSCOPIC EXAMINATION
Bacteria, UA: NONE SEEN
RBC: NONE SEEN /hpf (ref 0–2)
WBC, UA: NONE SEEN /hpf (ref 0–5)

## 2018-10-19 ENCOUNTER — Other Ambulatory Visit: Payer: Self-pay | Admitting: Oncology

## 2018-11-03 ENCOUNTER — Ambulatory Visit
Admission: RE | Admit: 2018-11-03 | Discharge: 2018-11-03 | Disposition: A | Discharge status: Home / Self Care | Payer: BLUE CROSS/BLUE SHIELD | Source: Ambulatory Visit | Attending: Radiation Oncology | Admitting: Radiation Oncology

## 2018-11-03 ENCOUNTER — Encounter: Payer: Self-pay | Admitting: Radiation Oncology

## 2018-11-03 ENCOUNTER — Other Ambulatory Visit: Payer: Self-pay

## 2018-11-03 VITALS — BP 131/89 | HR 78 | Temp 97.9°F | Resp 18 | Wt 225.2 lb

## 2018-11-03 DIAGNOSIS — C50411 Malignant neoplasm of upper-outer quadrant of right female breast: Secondary | ICD-10-CM

## 2018-11-03 DIAGNOSIS — Z79811 Long term (current) use of aromatase inhibitors: Secondary | ICD-10-CM | POA: Insufficient documentation

## 2018-11-03 DIAGNOSIS — Z170 Estrogen receptor positive status [ER+]: Secondary | ICD-10-CM | POA: Diagnosis not present

## 2018-11-03 DIAGNOSIS — Z923 Personal history of irradiation: Secondary | ICD-10-CM | POA: Insufficient documentation

## 2018-11-03 NOTE — Progress Notes (Signed)
Radiation Oncology Follow up Note  Name: Rhonda Schneider   Date:   11/03/2018 MRN:  [id number] DOB: 08/30/1960    This 59 y.o. female presents to the clinic today for 39-monthfollow-up status post whole breast radiation to her right breast for stage I ER PR positive invasive mammary carcinoma.  REFERRING PROVIDER: Schermerhorn, TGwen Her*  HPI: Patient is a 58year old female now about 6 months having completed whole breast radiation to her right breast for stage I ER PR positive HER-2 negative invasive mammary carcinoma.  Seen today in routine follow-up she is doing well.  She specifically denies breast tenderness cough or bone pain.  She is not yet had follow-up mammograms.  She is currently on.  Arimadex tolerated that well without side effect.  She recently had cystoscopy by urology showing no evidence of bladder disease.  She had a mammogram back in June which was BI-RADS 2 benign which I have reviewed  COMPLICATIONS OF TREATMENT: none  FOLLOW UP COMPLIANCE: keeps appointments   PHYSICAL EXAM:  BP 131/89   Pulse 78   Temp 97.9 F (36.6 C)   Resp 18   Wt 225 lb 3.2 oz (102.2 kg)   BMI 36.35 kg/m  Lungs are clear to A&P cardiac examination essentially unremarkable with regular rate and rhythm. No dominant mass or nodularity is noted in either breast in 2 positions examined. Incision is well-healed. No axillary or supraclavicular adenopathy is appreciated. Cosmetic result is excellent.  Well-developed well-nourished patient in NAD. HEENT reveals PERLA, EOMI, discs not visualized.  Oral cavity is clear. No oral mucosal lesions are identified. Neck is clear without evidence of cervical or supraclavicular adenopathy. Lungs are clear to A&P. Cardiac examination is essentially unremarkable with regular rate and rhythm without murmur rub or thrill. Abdomen is benign with no organomegaly or masses noted. Motor sensory and DTR levels are equal and symmetric in the upper and lower extremities.  Cranial nerves II through XII are grossly intact. Proprioception is intact. No peripheral adenopathy or edema is identified. No motor or sensory levels are noted. Crude visual fields are within normal range.  RADIOLOGY RESULTS: Mammograms reviewed and compatible with above-stated findings  PLAN: Patient is now 6 months out from whole breast radiation doing well with no evidence of disease.  I am pleased with her overall progress.  She continues on arimadex without side effect.  I have asked to see her back in 6 months for follow-up and then will start once a year follow-up visits.  Patient knows to call with any concerns.  I would like to take this opportunity to thank you for allowing me to participate in the care of your patient..Noreene Filbert MD

## 2018-11-04 ENCOUNTER — Other Ambulatory Visit: Payer: Self-pay | Admitting: Oncology

## 2018-12-20 ENCOUNTER — Other Ambulatory Visit: Payer: Self-pay

## 2018-12-20 ENCOUNTER — Encounter: Payer: Self-pay | Admitting: Oncology

## 2018-12-20 ENCOUNTER — Inpatient Hospital Stay: Payer: BLUE CROSS/BLUE SHIELD | Attending: Oncology | Admitting: Oncology

## 2018-12-20 VITALS — BP 123/81 | HR 90 | Temp 97.8°F | Resp 20 | Ht 66.0 in | Wt 227.0 lb

## 2018-12-20 DIAGNOSIS — Z170 Estrogen receptor positive status [ER+]: Secondary | ICD-10-CM | POA: Diagnosis not present

## 2018-12-20 DIAGNOSIS — C50911 Malignant neoplasm of unspecified site of right female breast: Secondary | ICD-10-CM | POA: Diagnosis not present

## 2018-12-20 DIAGNOSIS — Z923 Personal history of irradiation: Secondary | ICD-10-CM | POA: Insufficient documentation

## 2018-12-20 DIAGNOSIS — Z803 Family history of malignant neoplasm of breast: Secondary | ICD-10-CM | POA: Diagnosis not present

## 2018-12-20 DIAGNOSIS — Z79811 Long term (current) use of aromatase inhibitors: Secondary | ICD-10-CM | POA: Insufficient documentation

## 2018-12-20 DIAGNOSIS — Z87891 Personal history of nicotine dependence: Secondary | ICD-10-CM | POA: Diagnosis not present

## 2018-12-20 DIAGNOSIS — Z79899 Other long term (current) drug therapy: Secondary | ICD-10-CM | POA: Diagnosis not present

## 2018-12-20 DIAGNOSIS — Z08 Encounter for follow-up examination after completed treatment for malignant neoplasm: Secondary | ICD-10-CM | POA: Diagnosis not present

## 2018-12-20 DIAGNOSIS — Z853 Personal history of malignant neoplasm of breast: Secondary | ICD-10-CM

## 2018-12-20 DIAGNOSIS — Z5181 Encounter for therapeutic drug level monitoring: Secondary | ICD-10-CM | POA: Diagnosis not present

## 2018-12-27 NOTE — Progress Notes (Signed)
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 She recently had a screening bilateral mammogram which showed left breast cysts and architectural distortion in her right breast warranting a biopsy. No evidence of abnormal axillary adenopathy noted on scans. 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 Size of invasive carcinoma was 1.7 mm, grade 1. Lymphovascular invasion not identified. ER PR positive and HER-2/neu negative.   Patient has seen Dr. Peyton Najjar and is scheduled to undergolumpectomy and sentinel lymph node biopsy next week.  Patient's family history significant for her mother who had breast cancer in her 64s. Patient herself has not had any prior abnormal mammograms or breast biopsies. She is currently postmenopausal and has not had any menstrual cycles for about 3 years. She used birth control remotely in the 41s. Currently she is not on any hormone replacement therapy. She is G2, P2 L2.  Final pathology showed 7 mm invasive mammary carcinoma, grade 1 with negative margins. Sentinel lymph nodes were negative. pT1b pN0 ER PR positive HER-2/neu negative  Given that tumor was grade 1 and <10 mm, she did not require adjuvant chemotherapy or oncotype testingH. She completed adjuvant radiation and started Arimidex in October 2019   Interval history-she is tolerating Arimidex well without any significant side effects.  Denies any aches  or pains in her joints.  ECOG PS- 1 Pain scale- 0   Review of systems- Review of Systems  Constitutional: Negative for chills, fever, malaise/fatigue and weight loss.  HENT: Negative for congestion, ear discharge and nosebleeds.   Eyes: Negative for blurred vision.  Respiratory: Negative for cough, hemoptysis, sputum production, shortness of breath and wheezing.   Cardiovascular: Negative for chest pain, palpitations, orthopnea and claudication.  Gastrointestinal: Negative for abdominal pain, blood in stool, constipation, diarrhea, heartburn, melena, nausea and vomiting.  Genitourinary: Negative for dysuria, flank pain, frequency, hematuria and urgency.  Musculoskeletal: Negative for back pain, joint pain and myalgias.  Skin: Negative for rash.  Neurological: Negative for dizziness, tingling, focal weakness, seizures, weakness and headaches.  Endo/Heme/Allergies: Does not bruise/bleed easily.  Psychiatric/Behavioral: Negative for depression and suicidal ideas. The patient does not have insomnia.       No Known Allergies   Past Medical History:  Diagnosis Date  . Bladder tumor   . Breast cancer ([place])   . Cancer ([place]) 2018   kidney  . History of kidney stones    H/O     Past Surgical History:  Procedure Laterality Date  . APPENDECTOMY  1978   ruptured  . BREAST BIOPSY Right 10/12/2017   stereotactic biopsy - distortion - x clip  . BREAST LUMPECTOMY Right 11/01/2017   lumpectomy low-grade invasive carcinoma, ductal carcinoma in situ  . CESAREAN SECTION  1987  . CYSTOSCOPY W/ URETERAL STENT PLACEMENT Right 11/13/2015   Procedure: CYSTOSCOPY WITH STENT REPLACEMENT;  Surgeon: Nickie Retort, MD;  Location: [facility] ORS;  Service: Urology;  Laterality: Right;  . CYSTOSCOPY WITH STENT PLACEMENT Right 10/31/2015   Procedure: CYSTOSCOPY WITH STENT PLACEMENT;  Surgeon: Aaron Edelman  Harolyn Rutherford, MD;  Location: [facility] OR[facility];  Service: Urology;  Laterality: Right;  . KIDNEY CYST REMOVAL      2018  . PARTIAL MASTECTOMY WITH NEEDLE LOCALIZATION Right 11/01/2017   Procedure: PARTIAL MASTECTOMY WITH NEEDLE LOCALIZATION;  Surgeon: Herbert Pun, MD;  Location: [facility] OR[facility];  Service: General;  Laterality: Right;  . SENTINEL NODE BIOPSY Right 11/01/2017   Procedure: SENTINEL NODE BIOPSY;  Surgeon: Herbert Pun, MD;  Location: [facility] ORS;  Service: General;  Laterality: Right;  . TRANSURETHRAL RESECTION OF BLADDER TUMOR N/A 11/13/2015   Procedure: TRANSURETHRAL RESECTION OF BLADDER TUMOR (TURBT);  Surgeon: Nickie Retort, MD;  Location: [facility] ORS;  Service: Urology;  Laterality: N/A;  . TUBAL LIGATION    . URETEROSCOPY WITH HOLMIUM LASER LITHOTRIPSY Right 11/13/2015   Procedure: URETEROSCOPY WITH HOLMIUM LASER LITHOTRIPSY;  Surgeon: Nickie Retort, MD;  Location: [facility] OR[facility];  Service: Urology;  Laterality: Right;    Social History   Socioeconomic History  . Marital status: Married    Spouse name: Not on file  . Number of children: Not on file  . Years of education: Not on file  . Highest education level: Not on file  Occupational History  . Not on file  Social Needs  . Financial resource strain: Not on file  . Food insecurity    Worry: Not on file    Inability: Not on file  . Transportation needs    Medical: Not on file    Non-medical: Not on file  Tobacco Use  . Smoking status: Former Smoker    Packs/day: 1.00    Years: 20.00    Pack years: 20.00    Types: Cigarettes    Quit date: 10/27/2006    Years since quitting: 12.1  . Smokeless tobacco: Never Used  Substance and Sexual Activity  . Alcohol use: No  . Drug use: No  . Sexual activity: Yes  Lifestyle  . Physical activity    Days per week: Not on file    Minutes per session: Not on file  . Stress: Not on file  Relationships  . Social Herbalist on phone: Not on file    Gets together: Not on file    Attends religious service: Not on file    Active member of club or organization: Not on  file    Attends meetings of clubs or organizations: Not on file    Relationship status: Not on file  . Intimate partner violence    Fear of current or ex partner: Not on file    Emotionally abused: Not on file    Physically abused: Not on file    Forced sexual activity: Not on file  Other Topics Concern  . Not on file  Social History Narrative  . Not on file    Family History  Problem Relation Age of Onset  . Heart attack Father   . Cancer Mother 35       breast  . Breast cancer Mother 39  . Coronary artery disease Mother 58       "open heart surgery"  . Heart attack Mother   . Developmental delay Sister        lves at [facility] center -in 72's  . Kidney disease Neg Hx      Current Outpatient Medications:  .  anastrozole (ARIMIDEX) 1 MG tablet, TAKE 1 TABLET(1 MG) BY MOUTH DAILY, Disp: 90 tablet, Rfl: 0 .  Biotin 1000 MCG tablet, Take 1,000 mcg  by mouth 3 (three) times daily., Disp: , Rfl:  .  Calcium Carbonate-Vitamin D (CALCIUM 600+D PO), Take 1 tablet by mouth 2 (two) times a day., Disp: , Rfl:  .  Multiple Vitamin (MULTIVITAMIN) tablet, Take 1 tablet by mouth daily., Disp: , Rfl:  .  simvastatin (ZOCOR) 20 MG tablet, Take 20 mg by mouth at bedtime. , Disp: , Rfl:  .  Turmeric 500 MG CAPS, Take 1 tablet by mouth daily., Disp: , Rfl:   Physical exam:  Vitals:   12/20/18 1410  BP: 123/81  Pulse: 90  Resp: 20  Temp: 97.8 F (36.6 C)  TempSrc: Tympanic  Weight: 227 lb (103 kg)  Height: _0  (1.676 m)   Physical Exam HENT:     Head: Normocephalic and atraumatic.  Eyes:     Pupils: Pupils are equal, round, and reactive to light.  Neck:     Musculoskeletal: Normal range of motion.  Cardiovascular:     Rate and Rhythm: Normal rate and regular rhythm.     Heart sounds: Normal heart sounds.  Pulmonary:     Effort: Pulmonary effort is normal.     Breath sounds: Normal breath sounds.  Abdominal:     General: Bowel sounds are normal.     Palpations: Abdomen is  soft.  Skin:    General: Skin is warm and dry.  Neurological:     Mental Status: She is alert and oriented to person, place, and time.      CMP Latest Ref Rng & Units 03/17/2018  Glucose 70 - 99 mg/dL 144(H)  BUN 6 - 20 mg/dL 13  Creatinine 0.44 - 1.00 mg/dL 0.92  Sodium 135 - 145 mmol/L 141  Potassium 3.5 - 5.1 mmol/L 3.8  Chloride 98 - 111 mmol/L 104  CO2 22 - 32 mmol/L 29  Calcium 8.9 - 10.3 mg/dL 9.7  Total Protein 6.5 - 8.1 g/dL 7.1  Total Bilirubin 0.3 - 1.2 mg/dL 0.6  Alkaline Phos 38 - 126 U/L 87  AST 15 - 41 U/L 21  ALT 0 - 44 U/L 25   CBC Latest Ref Rng & Units 01/12/2018  WBC 3.6 - 11.0 K/uL 3.7  Hemoglobin 12.0 - 16.0 g/dL 13.8  Hematocrit 35.0 - 47.0 % 40.3  Platelets 150 - 440 K/uL 159      Assessment and plan- Patient is a 58 y.o. female with a history of stage Ib invasive mammary carcinoma of the right breast pT1b pN0 cM0 ER PR positive HER-2/neu negative status post lumpectomy and adjuvant radiation therapy.  This is a routine follow-up of breast cancer on Arimidex    Recent mammogram from June 2020 showed no evidence of malignancy.  Patient is tolerating Arimidex well without any significant side effects.  She is almost a year since her breast cancer diagnosis.  Clinically there are no concerning signs and symptoms of recurrence.  I will see her back in 6 months no labs.   Patient will continue taking Arimidex along with calcium and vitamin D for 5 years   Visit Diagnosis 1. Encounter for follow-up surveillance of breast cancer   2. Visit for monitoring Arimidex therapy      Dr. Randa Evens, MD, MPH [facility] [phone] 12/27/2018 9:49 AM

## 2019-02-03 ENCOUNTER — Other Ambulatory Visit: Payer: Self-pay

## 2019-02-03 ENCOUNTER — Other Ambulatory Visit
Admission: RE | Admit: 2019-02-03 | Discharge: 2019-02-03 | Disposition: A | Discharge status: Home / Self Care | Payer: BLUE CROSS/BLUE SHIELD | Source: Ambulatory Visit | Attending: Internal Medicine | Admitting: Internal Medicine

## 2019-02-03 DIAGNOSIS — Z20828 Contact with and (suspected) exposure to other viral communicable diseases: Secondary | ICD-10-CM | POA: Diagnosis not present

## 2019-02-03 DIAGNOSIS — Z01812 Encounter for preprocedural laboratory examination: Secondary | ICD-10-CM | POA: Insufficient documentation

## 2019-02-03 LAB — SARS CORONAVIRUS 2 (TAT 6-24 HRS): SARS Coronavirus 2: NEGATIVE

## 2019-02-06 ENCOUNTER — Other Ambulatory Visit: Payer: Self-pay | Admitting: Oncology

## 2019-02-06 MED ORDER — ANASTROZOLE 1 MG PO TABS: 1.0000 mg | ORAL_TABLET | Freq: Every day | ORAL | 3 refills | Status: AC

## 2019-02-08 ENCOUNTER — Ambulatory Visit
Admission: RE | Admit: 2019-02-08 | Discharge: 2019-02-08 | Disposition: A | Discharge status: Home / Self Care | Payer: BLUE CROSS/BLUE SHIELD | Attending: Internal Medicine | Admitting: Internal Medicine

## 2019-02-08 ENCOUNTER — Encounter
Admission: RE | Disposition: A | Discharge status: Home / Self Care | Payer: Self-pay | Source: Home / Self Care | Attending: Internal Medicine

## 2019-02-08 ENCOUNTER — Other Ambulatory Visit: Payer: Self-pay

## 2019-02-08 ENCOUNTER — Encounter: Payer: Self-pay | Admitting: *Deleted

## 2019-02-08 ENCOUNTER — Ambulatory Visit: Payer: BLUE CROSS/BLUE SHIELD | Admitting: Anesthesiology

## 2019-02-08 DIAGNOSIS — Z1211 Encounter for screening for malignant neoplasm of colon: Secondary | ICD-10-CM | POA: Diagnosis present

## 2019-02-08 DIAGNOSIS — K640 First degree hemorrhoids: Secondary | ICD-10-CM | POA: Diagnosis not present

## 2019-02-08 DIAGNOSIS — Z87891 Personal history of nicotine dependence: Secondary | ICD-10-CM | POA: Insufficient documentation

## 2019-02-08 HISTORY — PX: COLONOSCOPY WITH PROPOFOL: SHX5780

## 2019-02-08 HISTORY — DX: Hyperlipidemia, unspecified: E78.5

## 2019-02-08 SURGERY — COLONOSCOPY WITH PROPOFOL
Anesthesia: General

## 2019-02-08 MED ORDER — SODIUM CHLORIDE 0.9 % IV SOLN
INTRAVENOUS | Status: DC
  Administered 2019-02-08 (×2): via INTRAVENOUS

## 2019-02-08 MED ORDER — GLYCOPYRROLATE 0.2 MG/ML IJ SOLN
INTRAMUSCULAR | Status: DC | PRN
  Administered 2019-02-08: 0.2 mg via INTRAVENOUS

## 2019-02-08 MED ORDER — PROPOFOL 500 MG/50ML IV EMUL
INTRAVENOUS | Status: DC | PRN
  Administered 2019-02-08: 150 ug/kg/min via INTRAVENOUS

## 2019-02-08 MED ORDER — PROPOFOL 10 MG/ML IV BOLUS
INTRAVENOUS | Status: DC | PRN
  Administered 2019-02-08 (×2): 20 mg via INTRAVENOUS
  Administered 2019-02-08: 50 mg via INTRAVENOUS

## 2019-02-08 NOTE — Anesthesia Postprocedure Evaluation (Signed)
Anesthesia Post Note  Patient: Rhonda Schneider  Procedure(s) Performed: COLONOSCOPY WITH PROPOFOL (N/A )  Patient location during evaluation: Endoscopy Anesthesia Type: General Level of consciousness: awake and alert Pain management: pain level controlled Vital Signs Assessment: post-procedure vital signs reviewed and stable Respiratory status: spontaneous breathing and respiratory function stable Cardiovascular status: stable Anesthetic complications: no     Last Vitals:  Vitals:   02/08/19 0935 02/08/19 1102  BP: 140/82 128/80  Pulse: 72 86  Resp: 18 12  Temp: (!) 35.6 C (!) 36 C  SpO2: 98% 100%    Last Pain:  Vitals:   02/08/19 1102  TempSrc: Tympanic                 Trygve Thal K

## 2019-02-08 NOTE — Interval H&P Note (Signed)
History and Physical Interval Note:  02/08/2019 10:31 AM  Rhonda Schneider  has presented today for surgery, with the diagnosis of SCREENING.  The various methods of treatment have been discussed with the patient and family. After consideration of risks, benefits and other options for treatment, the patient has consented to  Procedure(s): COLONOSCOPY WITH PROPOFOL (N/A) as a surgical intervention.  The patient's history has been reviewed, patient examined, no change in status, stable for surgery.  I have reviewed the patient's chart and labs.  Questions were answered to the patient's satisfaction.     Westwood, Christopher

## 2019-02-08 NOTE — Transfer of Care (Signed)
Immediate Anesthesia Transfer of Care Note  Patient: Rhyli R Cumbie  Procedure(s) Performed: COLONOSCOPY WITH PROPOFOL (N/A )  Patient Location: Endoscopy Unit  Anesthesia Type:General  Level of Consciousness: drowsy and patient cooperative  Airway & Oxygen Therapy: Patient Spontanous Breathing and Patient connected to face mask oxygen  Post-op Assessment: Report given to RN and Post -op Vital signs reviewed and stable  Post vital signs: Reviewed and stable  Last Vitals:  Vitals Value Taken Time  BP 128/80 02/08/19 1102  Temp 36 C 02/08/19 1102  Pulse 86 02/08/19 1103  Resp 11 02/08/19 1103  SpO2 100 % 02/08/19 1103  Vitals shown include unvalidated device data.  Last Pain:  Vitals:   02/08/19 1102  TempSrc: Tympanic         Complications: No apparent anesthesia complications

## 2019-02-08 NOTE — H&P (Signed)
Outpatient short stay form Pre-procedure 02/08/2019 10:30 AM Rhonda Schneider K. Alice Reichert, M.D.  Primary Physician:  Laverta Baltimore, M.D.  Reason for visit:  Colon cancer screening  History of present illness:  Patient presents for colonoscopy for colon cancer screening. The patient denies complaints of abdominal pain, significant change in bowel habits, or rectal bleeding.     Current Facility-Administered Medications:  .  0.9 %  sodium chloride infusion, , Intravenous, Continuous, [place], Benay Pike, MD, Last Rate: 20 mL/hr at 02/08/19 [id number]  Medications Prior to Admission  Medication Sig Dispense Refill Last Dose  . anastrozole (ARIMIDEX) 1 MG tablet Take 1 tablet (1 mg total) by mouth daily. 90 tablet 3 02/07/2019 at 1930  . Biotin 1000 MCG tablet Take 1,000 mcg by mouth 3 (three) times daily.   02/08/2019 at 0730  . Calcium Carbonate-Vitamin D (CALCIUM 600+D PO) Take 1 tablet by mouth 2 (two) times a day.   02/08/2019 at 0730  . Multiple Vitamin (MULTIVITAMIN) tablet Take 1 tablet by mouth daily.   Past Week at Unknown time  . simvastatin (ZOCOR) 20 MG tablet Take 20 mg by mouth at bedtime.    02/07/2019 at [place]  . Turmeric 500 MG CAPS Take 1 tablet by mouth daily.   02/08/2019 at 0730     No Known Allergies   Past Medical History:  Diagnosis Date  . Bladder tumor   . Breast cancer ([place])   . Cancer ([place]) 2018   kidney  . History of kidney stones    H/O  . Hyperlipemia     Review of systems:  Otherwise negative.    Physical Exam  Gen: Alert, oriented. Appears stated age.  HEENT: [place]/AT. PERRLA. Lungs: CTA, no wheezes. CV: RR nl S1, S2. Abd: soft, benign, no masses. BS+ Ext: No edema. Pulses 2+    Planned procedures: Proceed with colonoscopy. The patient understands the nature of the planned procedure, indications, risks, alternatives and potential complications including but not limited to bleeding, infection, perforation, damage to internal organs and possible  oversedation/side effects from anesthesia. The patient agrees and gives consent to proceed.  Please refer to procedure notes for findings, recommendations and patient disposition/instructions.     Augustine Brannick K. Alice Reichert, M.D. Gastroenterology 02/08/2019  10:30 AM

## 2019-02-08 NOTE — Anesthesia Preprocedure Evaluation (Signed)
Anesthesia Evaluation  Patient identified by MRN, date of birth, ID band Patient awake    Reviewed: Allergy & Precautions, NPO status , Patient's Chart, lab work & pertinent test results  History of Anesthesia Complications Negative for: history of anesthetic complications  Airway Mallampati: III       Dental   Pulmonary neg sleep apnea, neg COPD, Not current smoker, former smoker,           Cardiovascular (-) hypertension(-) Past MI and (-) CHF (-) pacemaker(-) Valvular Problems/Murmurs     Neuro/Psych neg Seizures    GI/Hepatic Neg liver ROS, neg GERD  ,  Endo/Other  neg diabetes  Renal/GU Renal disease (stones, cladder tumor)     Musculoskeletal   Abdominal   Peds  Hematology   Anesthesia Other Findings   Reproductive/Obstetrics                             Anesthesia Physical Anesthesia Plan  ASA: II  Anesthesia Plan: General   Post-op Pain Management:    Induction: Intravenous  PONV Risk Score and Plan: 3 and Propofol infusion, TIVA and Ondansetron  Airway Management Planned: Nasal Cannula  Additional Equipment:   Intra-op Plan:   Post-operative Plan:   Informed Consent: I have reviewed the patients History and Physical, chart, labs and discussed the procedure including the risks, benefits and alternatives for the proposed anesthesia with the patient or authorized representative who has indicated his/her understanding and acceptance.       Plan Discussed with:   Anesthesia Plan Comments:         Anesthesia Quick Evaluation

## 2019-02-08 NOTE — Anesthesia Post-op Follow-up Note (Signed)
Anesthesia QCDR form completed.        

## 2019-02-08 NOTE — Op Note (Signed)
[facility] Gastroenterology Patient Name: Rhonda Schneider Procedure Date: 02/08/2019 10:34 AM MRN: [id number] Account #: [id number] Date of Birth: 1960/06/13 Admit Type: Outpatient Age: 58 Room: [facility] ENDO ROOM 3 Gender: Female Note Status: Finalized Procedure:            Colonoscopy Indications:          Screening for colorectal malignant neoplasm Providers:            Benay Pike. Alice Reichert MD, MD Referring MD:         Boykin Nearing, MD (Referring MD) Medicines:            Propofol per Anesthesia Complications:        No immediate complications. Procedure:            Pre-Anesthesia Assessment:                       - The risks and benefits of the procedure and the                        sedation options and risks were discussed with the                        patient. All questions were answered and informed                        consent was obtained.                       - Patient identification and proposed procedure were                        verified prior to the procedure by the nurse. The                        procedure was verified in the procedure room.                       - ASA Grade Assessment: III - A patient with severe                        systemic disease.                       - After reviewing the risks and benefits, the patient                        was deemed in satisfactory condition to undergo the                        procedure.                       After obtaining informed consent, the colonoscope was                        passed under direct vision. Throughout the procedure,                        the patient's blood pressure, pulse, and oxygen  saturations were monitored continuously. The                        Colonoscope was introduced through the anus and                        advanced to the the cecum, identified by appendiceal                        orifice and ileocecal valve. The colonoscopy  was                        performed without difficulty. The patient tolerated the                        procedure well. The quality of the bowel preparation                        was good. The ileocecal valve, appendiceal orifice, and                        rectum were photographed. The entire colon was well                        visualized. Findings:      The perianal and digital rectal examinations were normal. Pertinent       negatives include normal sphincter tone and no palpable rectal lesions.      The colon (entire examined portion) appeared normal.      Non-bleeding internal hemorrhoids were found during retroflexion. The       hemorrhoids were Grade I (internal hemorrhoids that do not prolapse).      The exam was otherwise without abnormality. Impression:           - The entire examined colon is normal.                       - Non-bleeding internal hemorrhoids.                       - The examination was otherwise normal.                       - No specimens collected. Recommendation:       - Patient has a contact number available for                        emergencies. The signs and symptoms of potential                        delayed complications were discussed with the patient.                        Return to normal activities tomorrow. Written discharge                        instructions were provided to the patient.                       - Resume previous diet.                       -  Continue present medications.                       - Repeat colonoscopy in 10 years for screening purposes.                       - Return to GI office PRN.                       - The findings and recommendations were discussed with                        the patient. Procedure Code(s):    --- Professional ---                       [id number], Colorectal cancer screening; colonoscopy on                        individual not meeting criteria for high risk Diagnosis Code(s):    ---  Professional ---                       K64.0, First degree hemorrhoids                       Z12.11, Encounter for screening for malignant neoplasm                        of colon CPT copyright 2019 [employer]. All rights reserved. The codes documented in this report are preliminary and upon coder review may  be revised to meet current compliance requirements. Efrain Sella MD, MD 02/08/2019 11:00:42 AM This report has been signed electronically. Number of Addenda: 0 Note Initiated On: 02/08/2019 10:34 AM Scope Withdrawal Time: 0 hours 6 minutes 12 seconds  Total Procedure Duration: 0 hours 10 minutes 58 seconds  Estimated Blood Loss: Estimated blood loss: none.      [facility]

## 2019-02-09 ENCOUNTER — Encounter: Payer: Self-pay | Admitting: Internal Medicine

## 2019-04-13 ENCOUNTER — Other Ambulatory Visit: Payer: Self-pay

## 2019-04-13 ENCOUNTER — Encounter: Payer: Self-pay | Admitting: Physician Assistant

## 2019-04-13 ENCOUNTER — Ambulatory Visit: Payer: BLUE CROSS/BLUE SHIELD | Admitting: Physician Assistant

## 2019-04-13 VITALS — BP 132/84 | HR 95 | Ht 64.0 in | Wt 226.6 lb

## 2019-04-13 DIAGNOSIS — R350 Frequency of micturition: Secondary | ICD-10-CM | POA: Diagnosis not present

## 2019-04-13 DIAGNOSIS — M545 Low back pain, unspecified: Secondary | ICD-10-CM

## 2019-04-13 DIAGNOSIS — R82998 Other abnormal findings in urine: Secondary | ICD-10-CM | POA: Diagnosis not present

## 2019-04-13 LAB — URINALYSIS, COMPLETE
Bilirubin, UA: NEGATIVE
Glucose, UA: NEGATIVE
Ketones, UA: NEGATIVE
Nitrite, UA: NEGATIVE
Protein,UA: NEGATIVE
Specific Gravity, UA: 1.015 (ref 1.005–1.030)
Urobilinogen, Ur: 0.2 mg/dL (ref 0.2–1.0)
pH, UA: 6 (ref 5.0–7.5)

## 2019-04-13 LAB — MICROSCOPIC EXAMINATION: Bacteria, UA: NONE SEEN

## 2019-04-13 NOTE — Progress Notes (Signed)
04/13/2019 11:15 AM   Rhonda Schneider 1960/09/12 [id number]  CC: Bright yellow urine  HPI: Rhonda Schneider is a 58 y.o. female who presents today for evaluation of possible UTI. She is an established [facility] patient with a history of incidental low-grade pTa <3cm bladder cancer without recurrence, most recent cystoscopy on 10/11/2018 without evidence of disease.   Today, she reports some low back pain approximately two weeks ago that has since resolved, associated with bright yellow urine and urinary frequency. She denies dysuria, urgency, flank pain, and gross hematuria.  Reports drinking approximately 3 bottles of water daily.  She denies recent changes in medication, supplements, or dietary intake.  Of note, she has taken a turmeric supplement for approximately the last 10 months.  She has a history of nephrolithiasis in 2017, treated by Dr. Pilar Jarvis.  In-office UA today positive for trace-intact blood and 1+ leukocyte esterase; urine microscopy with 6-10 WBCs/HPF.  PMH: Past Medical History:  Diagnosis Date  . Bladder tumor   . Breast cancer ([place])   . Cancer ([place]) 2018   kidney  . History of kidney stones    H/O  . Hyperlipemia     Surgical History: Past Surgical History:  Procedure Laterality Date  . APPENDECTOMY  1978   ruptured  . BREAST BIOPSY Right 10/12/2017   stereotactic biopsy - distortion - x clip  . BREAST LUMPECTOMY Right 11/01/2017   lumpectomy low-grade invasive carcinoma, ductal carcinoma in situ  . CESAREAN SECTION  1987  . COLONOSCOPY WITH PROPOFOL N/A 02/08/2019   Procedure: COLONOSCOPY WITH PROPOFOL;  Surgeon: Toledo, Benay Pike, MD;  Location: [facility];  Service: Gastroenterology;  Laterality: N/A;  . CYSTOSCOPY W/ URETERAL STENT PLACEMENT Right 11/13/2015   Procedure: CYSTOSCOPY WITH STENT REPLACEMENT;  Surgeon: Nickie Retort, MD;  Location: [facility] ORS;  Service: Urology;  Laterality: Right;  . CYSTOSCOPY WITH STENT PLACEMENT Right 10/31/2015    Procedure: CYSTOSCOPY WITH STENT PLACEMENT;  Surgeon: Nickie Retort, MD;  Location: [facility] ORS;  Service: Urology;  Laterality: Right;  . KIDNEY CYST REMOVAL     2018  . PARTIAL MASTECTOMY WITH NEEDLE LOCALIZATION Right 11/01/2017   Procedure: PARTIAL MASTECTOMY WITH NEEDLE LOCALIZATION;  Surgeon: Herbert Pun, MD;  Location: [facility] ORS;  Service: General;  Laterality: Right;  . SENTINEL NODE BIOPSY Right 11/01/2017   Procedure: SENTINEL NODE BIOPSY;  Surgeon: Herbert Pun, MD;  Location: [facility] OR[facility];  Service: General;  Laterality: Right;  . TRANSURETHRAL RESECTION OF BLADDER TUMOR N/A 11/13/2015   Procedure: TRANSURETHRAL RESECTION OF BLADDER TUMOR (TURBT);  Surgeon: Nickie Retort, MD;  Location: [facility];  Service: Urology;  Laterality: N/A;  . TUBAL LIGATION    . URETEROSCOPY WITH HOLMIUM LASER LITHOTRIPSY Right 11/13/2015   Procedure: URETEROSCOPY WITH HOLMIUM LASER LITHOTRIPSY;  Surgeon: Nickie Retort, MD;  Location: [facility] ORS;  Service: Urology;  Laterality: Right;    Home Medications:  Allergies as of 04/13/2019   No Known Allergies     Medication List       Accurate as of April 13, 2019 11:15 AM. If you have any questions, ask your nurse or doctor.        Biotin 1000 MCG tablet Take 1,000 mcg by mouth 3 (three) times daily.   CALCIUM 600+D PO Take 1 tablet by mouth 2 (two) times a day.   multivitamin tablet Take 1 tablet by mouth daily.   simvastatin 20 MG tablet Commonly known as: ZOCOR Take 20 mg by mouth at  bedtime.   Turmeric 500 MG Caps Take 1 tablet by mouth daily.       Allergies:  No Known Allergies  Family History: Family History  Problem Relation Age of Onset  . Heart attack Father   . Cancer Mother 27       breast  . Breast cancer Mother 29  . Coronary artery disease Mother 28       "open heart surgery"  . Heart attack Mother   . Developmental delay Sister        lves at [facility] center -in 9's  . Kidney disease  Neg Hx     Social History:   reports that she quit smoking about 12 years ago. Her smoking use included cigarettes. She has a 20.00 pack-year smoking history. She has never used smokeless tobacco. She reports that she does not drink alcohol or use drugs.  ROS: UROLOGY Frequent Urination?: No Hard to postpone urination?: No Burning/pain with urination?: No Get up at night to urinate?: No Leakage of urine?: No Urine stream starts and stops?: No Trouble starting stream?: No Do you have to strain to urinate?: No Blood in urine?: No Urinary tract infection?: No Sexually transmitted disease?: No Injury to kidneys or bladder?: No Painful intercourse?: No Weak stream?: No Currently pregnant?: No Vaginal bleeding?: No Last menstrual period?: n  Gastrointestinal Nausea?: No Vomiting?: No Indigestion/heartburn?: No Diarrhea?: No Constipation?: No  Constitutional Fever: No Night sweats?: No Weight loss?: No Fatigue?: No  Skin Skin rash/lesions?: No Itching?: No  Eyes Blurred vision?: No Double vision?: No  Ears/Nose/Throat Sore throat?: No Sinus problems?: No  Hematologic/Lymphatic Swollen glands?: No Easy bruising?: No  Cardiovascular Leg swelling?: No Chest pain?: No  Respiratory Cough?: No Shortness of breath?: No  Endocrine Excessive thirst?: No  Musculoskeletal Back pain?: No Joint pain?: No  Neurological Headaches?: No Dizziness?: No  Psychologic Depression?: No Anxiety?: No  Physical Exam: BP 132/84   Pulse 95   Ht 5\' 4"  (1.626 m)   Wt 226 lb 9.6 oz (102.8 kg)   BMI 38.90 kg/m   Constitutional:  Alert and oriented, no acute distress, nontoxic appearing HEENT: [place], AT Cardiovascular: No clubbing, cyanosis, or edema Respiratory: Normal respiratory effort, no increased work of breathing Skin: No rashes, bruises or suspicious lesions Neurologic: Grossly intact, no focal deficits, moving all 4 extremities Psychiatric: Normal mood and  affect  Laboratory Data: Results for orders placed or performed in visit on 04/13/19  Microscopic Examination   URINE  Result Value Ref Range   WBC, UA 6-10 (A) 0 - 5 /hpf   RBC 0-2 0 - 2 /hpf   Epithelial Cells (non renal) 0-10 0 - 10 /hpf   Renal Epithel, UA 0-10 (A) None seen /hpf   Bacteria, UA None seen None seen/Few  Urinalysis, Complete  Result Value Ref Range   Specific Gravity, UA 1.015 1.005 - 1.030   pH, UA 6.0 5.0 - 7.5   Color, UA Yellow Yellow   Appearance Ur Clear Clear   Leukocytes,UA 1+ (A) Negative   Protein,UA Negative Negative/Trace   Glucose, UA Negative Negative   Ketones, UA Negative Negative   RBC, UA Trace (A) Negative   Bilirubin, UA Negative Negative   Urobilinogen, Ur 0.2 0.2 - 1.0 mg/dL   Nitrite, UA Negative Negative   Microscopic Examination See below:    Assessment & Plan:   58 year old female with a history of nephrolithiasis and incidental low-grade bladder cancer presents today with a 2-week history  of urinary frequency, discolored urine, and low back pain, now resolved.    1. Urinary frequency UA significant for mild pyuria today.  Will send for culture and treat if indicated. - Urinalysis, Complete - CULTURE, URINE COMPREHENSIVE  2. Urine color appears bright Likely associated with turmeric supplementation.  Nothing to do.  3. Acute bilateral low back pain without sciatica Resolved.  Bilateral presentation increases likelihood of MSK origin.  Given history of nephrolithiasis, will consider KUB if her pain returns.  Return if symptoms worsen or fail to improve.  Debroah Loop, PA-C  [facility] [address], [place], [place] [address] [phone]

## 2019-04-15 LAB — CULTURE, URINE COMPREHENSIVE

## 2019-05-18 ENCOUNTER — Ambulatory Visit: Payer: BLUE CROSS/BLUE SHIELD | Admitting: Radiation Oncology

## 2019-06-12 NOTE — Progress Notes (Signed)
Patient reports she has [facility], and has concerns about coverage for upcoming annual follow-up appointments.  Explained appointment with Dr. Baruch Gouty should be covered.  If appointment with Dr. Janese Banks is not, patient may be referred to [facility] oncologist for follow-up per Dr. Janese Banks.  LuAnn Chrismon recommended patient contact insurance provider for clarification on both appointments.  Patient to call back after insurance conversation.

## 2019-06-20 ENCOUNTER — Encounter: Payer: Self-pay | Admitting: *Deleted

## 2019-06-26 ENCOUNTER — Inpatient Hospital Stay: Payer: BLUE CROSS/BLUE SHIELD | Admitting: Oncology

## 2019-06-27 ENCOUNTER — Ambulatory Visit: Payer: BLUE CROSS/BLUE SHIELD | Admitting: Radiation Oncology

## 2019-06-27 ENCOUNTER — Ambulatory Visit
Admission: RE | Admit: 2019-06-27 | Discharge: 2019-06-27 | Disposition: A | Discharge status: Home / Self Care | Payer: BLUE CROSS/BLUE SHIELD | Source: Ambulatory Visit | Attending: Radiation Oncology | Admitting: Radiation Oncology

## 2019-06-30 ENCOUNTER — Telehealth: Payer: Self-pay | Admitting: Oncology

## 2019-06-30 NOTE — Telephone Encounter (Signed)
Writer phoned patient to follow up on insurance status. Patient stated that she was still speaking with her insurance company to see who was in [place] and that at this time she was still unsure if [facility] was in or out of network. Patient to phone [facility] and reschedule if she finds out that [facility] is in network.

## 2019-09-28 ENCOUNTER — Telehealth: Payer: Self-pay | Admitting: Urology

## 2019-09-28 NOTE — Telephone Encounter (Signed)
Pt had a cysto scheduled for 10/12/19 with Dr. Erlene Quan but her Rhonda Schneider is out of Network with [place] she would like to know if Dr. Erlene Quan has any recommendations at [facility] please Mychart message these recommendations.

## 2019-09-28 NOTE — Telephone Encounter (Signed)
No specific recommendations.  All [facility] providers are excellent.  Hollice Espy, MD

## 2019-10-12 ENCOUNTER — Other Ambulatory Visit: Payer: BLUE CROSS/BLUE SHIELD | Admitting: Urology

## 2020-03-24 IMAGING — MG MM DIGITAL DIAGNOSTIC BILAT W/ TOMO W/ CAD
6 of 12 series · 6 of 36 positions shown · non-contrast
Comparison: Previous exam(s).

CLINICAL DATA: Possible distortion in the upper-outer right breast
and possible mass in the lower outer left breast on a recent 3D
screening mammogram.

EXAM:
DIGITAL DIAGNOSTIC BILATERAL MAMMOGRAM WITH TOMO
ULTRASOUND BILATERAL BREAST

[L CC synth-2D]
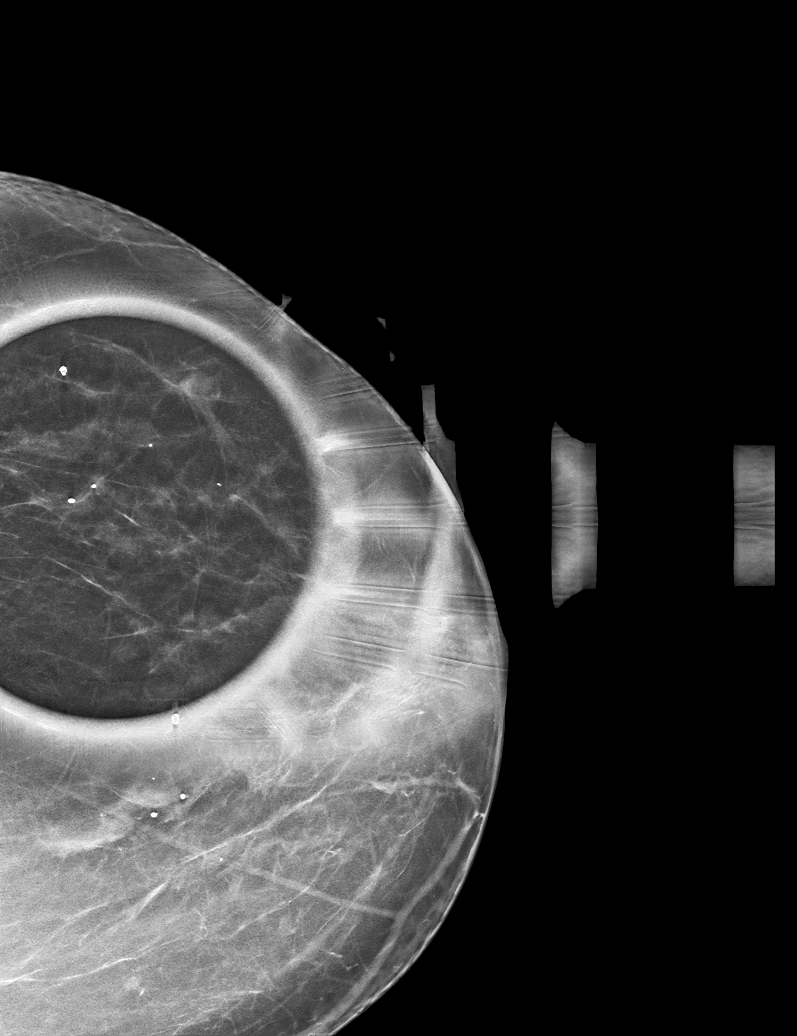

[L MLO synth-2D (1 of 2)]
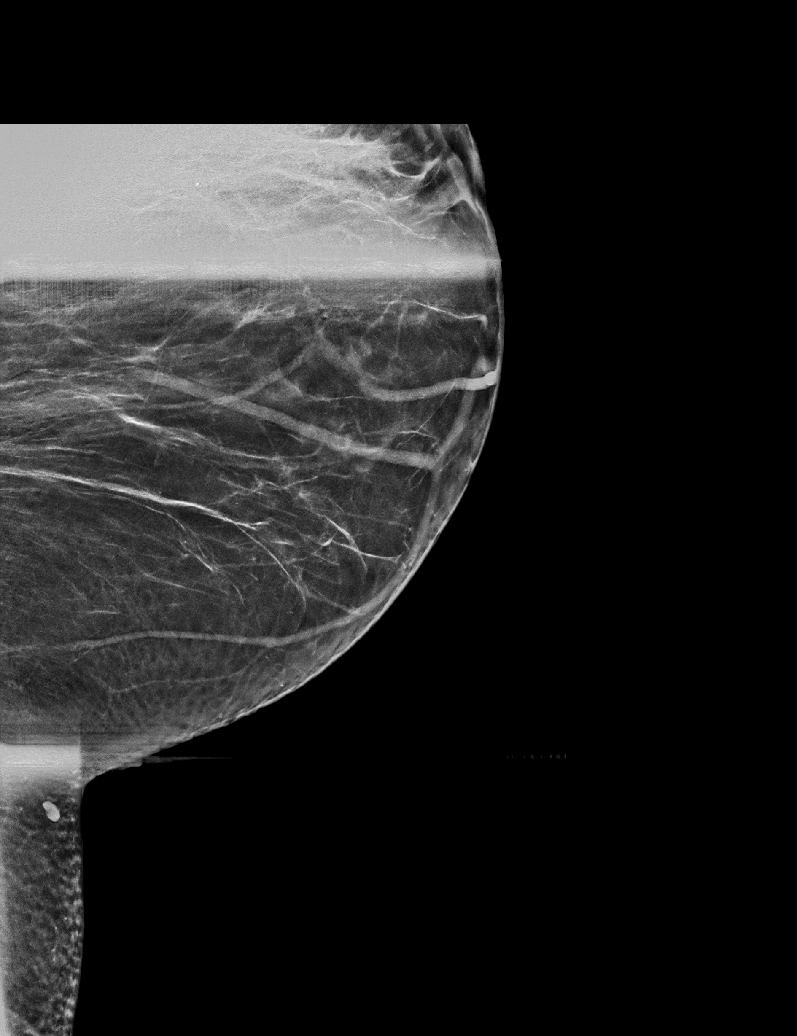

[R MLO synth-2D]
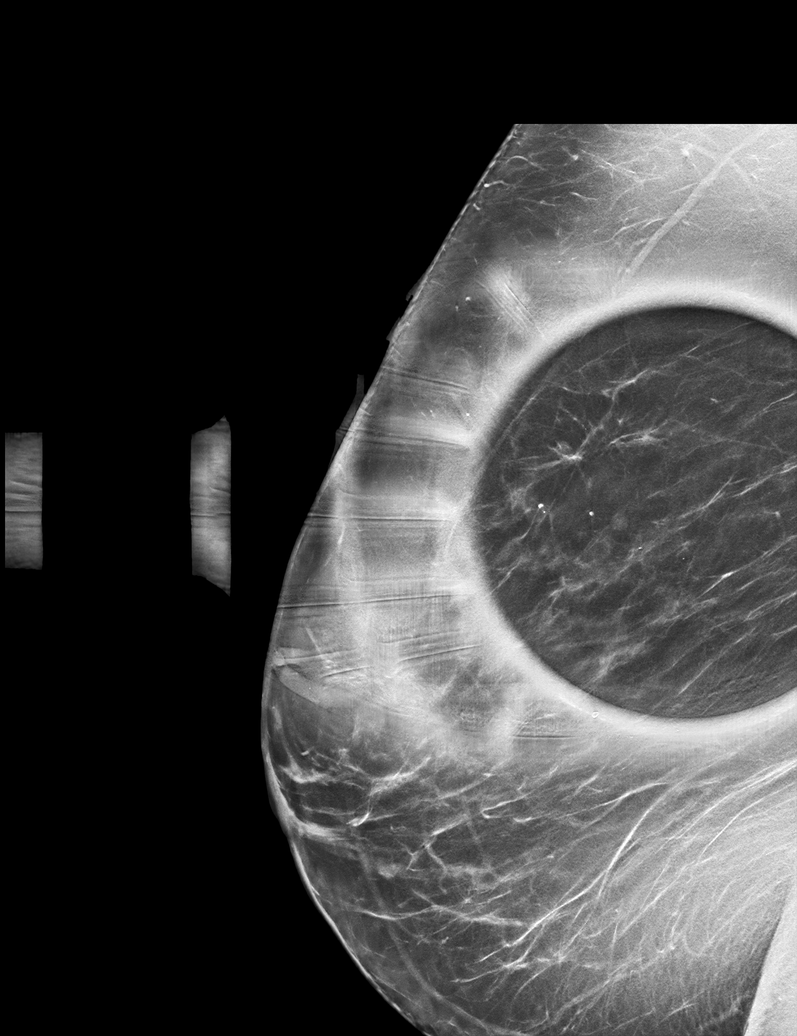

[R CC synth-2D (1 of 2)]
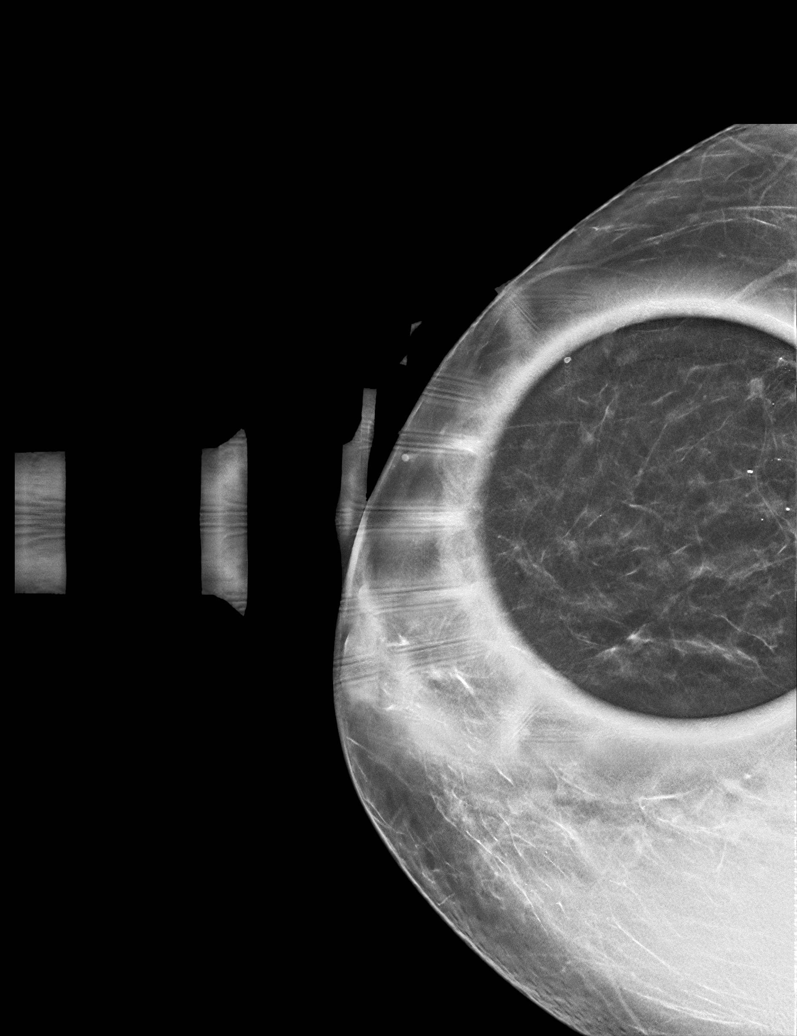

[R CC synth-2D (2 of 2)]
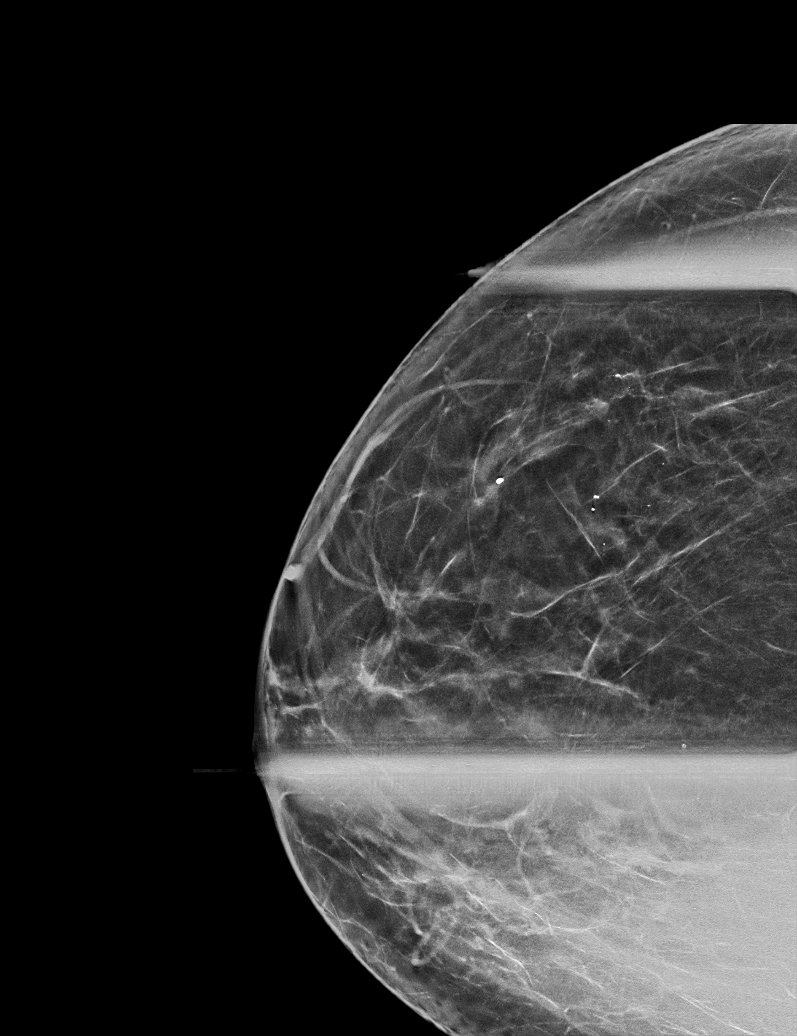

[L MLO synth-2D (2 of 2)]
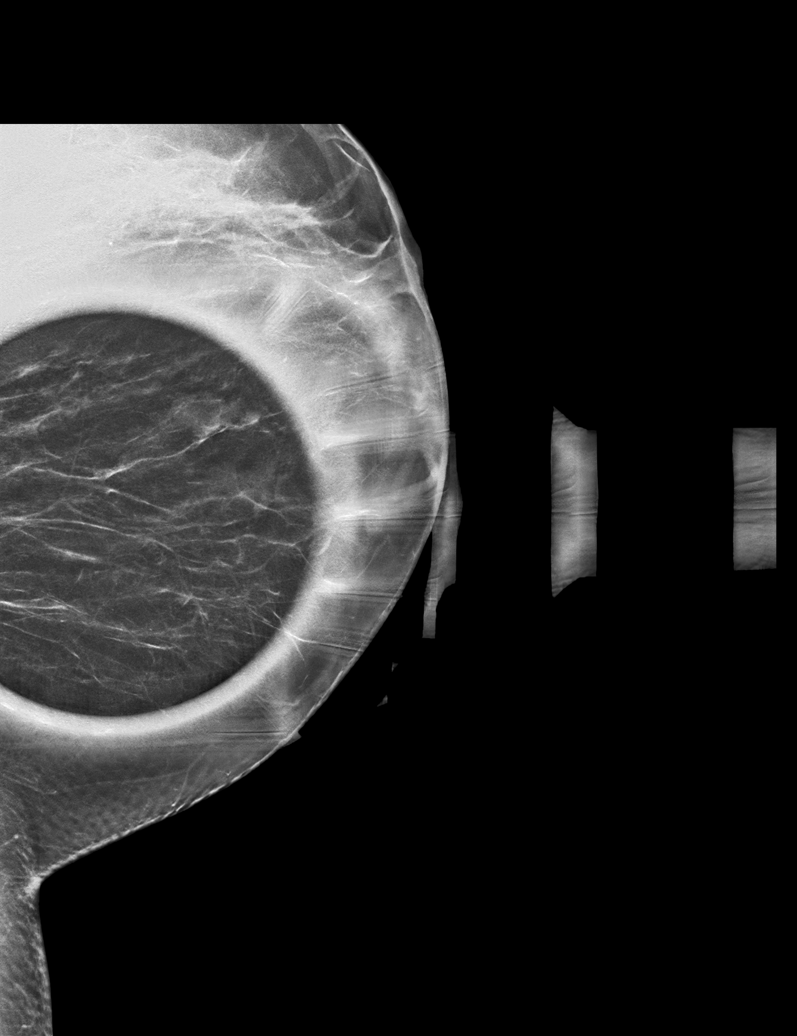

[6 of 36 positions shown; findings below may reference images not displayed]

ACR Breast Density Category b: There are scattered areas of
fibroglandular density.
FINDINGS: 2D and 3D spot compression views of both breasts were obtained.
These confirm an area of architectural distortion in the upper outer
quadrant of the right breast in the middle 3rd. These also confirm
an oval, circumscribed mass in the lower outer quadrant of the left
breast anteriorly. There are several additional, smaller, rounded
and oval circumscribed masses in that region of the breast.

On physical exam, no mass is palpable in either breast or either
axilla.

Targeted ultrasound is performed, showing normal appearing breast
tissue in the upper outer breast with no sonographically visible
distortion or mass.

Ultrasound of the right axilla demonstrated normal appearing right
axillary lymph nodes.

Multiple cysts are demonstrated in the lower outer quadrant of the
left breast, measuring up to 9 mm in maximum diameter each. No solid
masses were seen.
IMPRESSION: 1. Architectural distortion in the upper outer right breast with no
sonographic correlate. This is suspicious for malignancy or a
complex sclerosing lesion.
2. Multiple left breast cysts.

RECOMMENDATION:
3D stereotactic guided core needle biopsy of the area of
architectural distortion in the upper outer right breast. This has
been discussed with the patient and will be scheduled in
consultation with Dr. Ferienhaus.

I have discussed the findings and recommendations with the patient.
Results were also provided in writing at the conclusion of the
visit. If applicable, a reminder letter will be sent to the patient
regarding the next appointment.

BI-RADS CATEGORY  4: Suspicious.

## 2020-03-24 IMAGING — US US BREAST*R* LIMITED INC AXILLA
1 series · 4 of 4 positions shown · non-contrast
Comparison: Previous exam(s).

CLINICAL DATA: Possible distortion in the upper-outer right breast
and possible mass in the lower outer left breast on a recent 3D
screening mammogram.

EXAM:
DIGITAL DIAGNOSTIC BILATERAL MAMMOGRAM WITH TOMO
ULTRASOUND BILATERAL BREAST

[Series 1: us breast*right* limited inc axilla · 0.07mm/px · 4 of 4 slices shown]
[im 1/4]
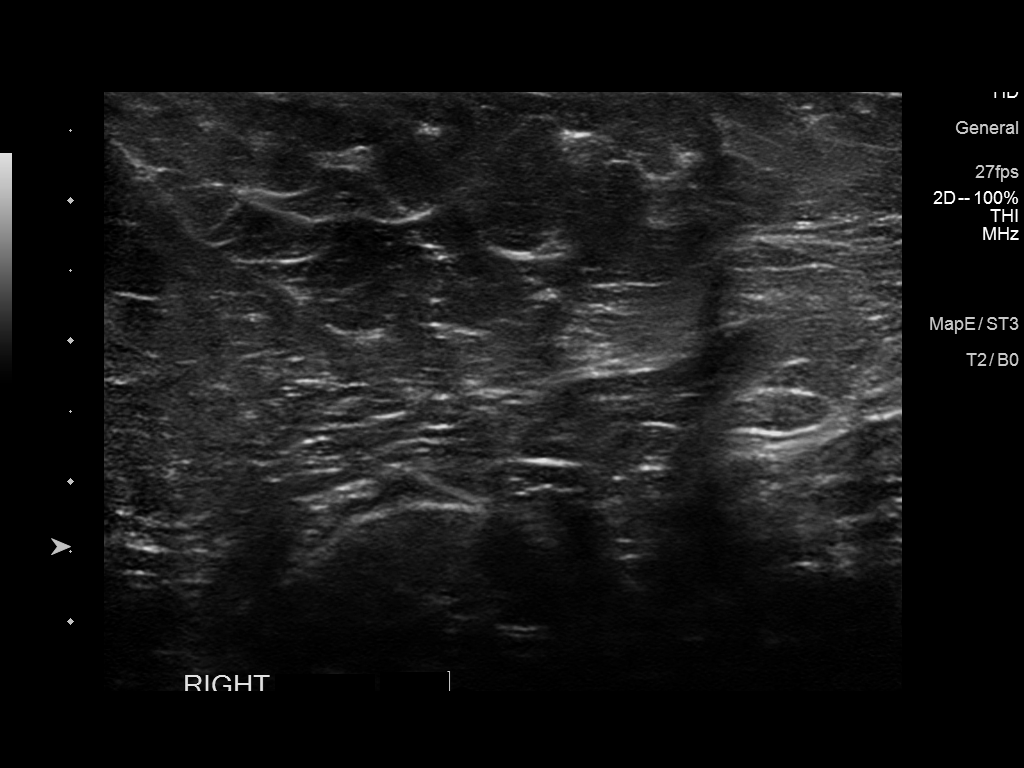
[im 2/4]
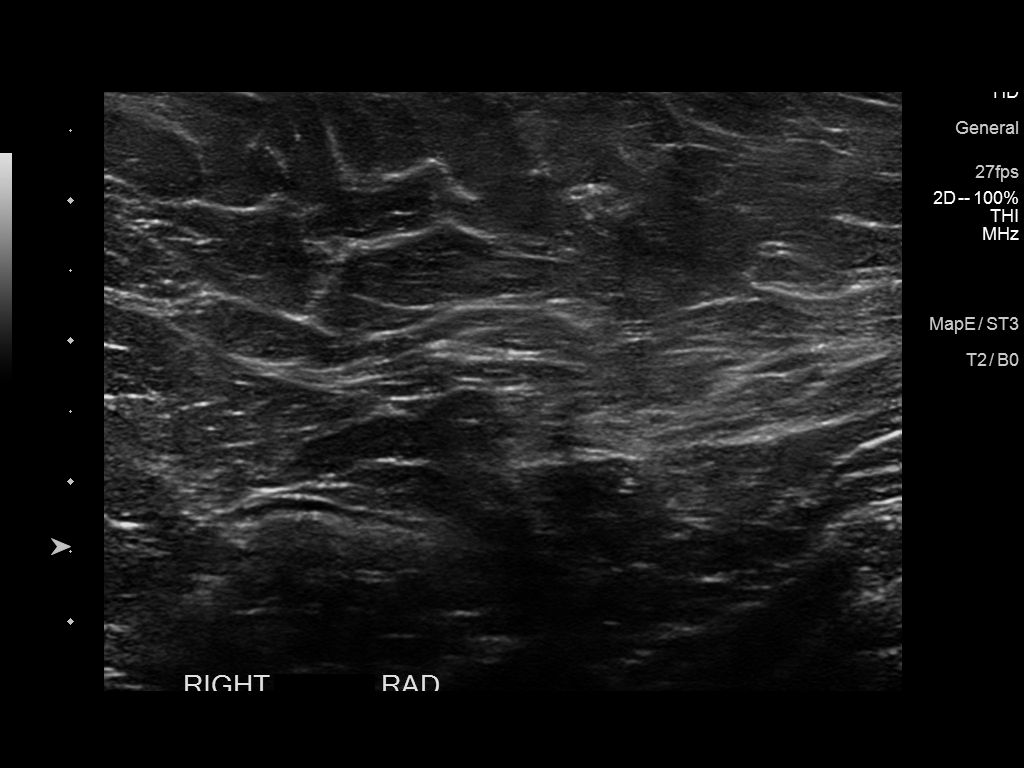
[im 3/4]
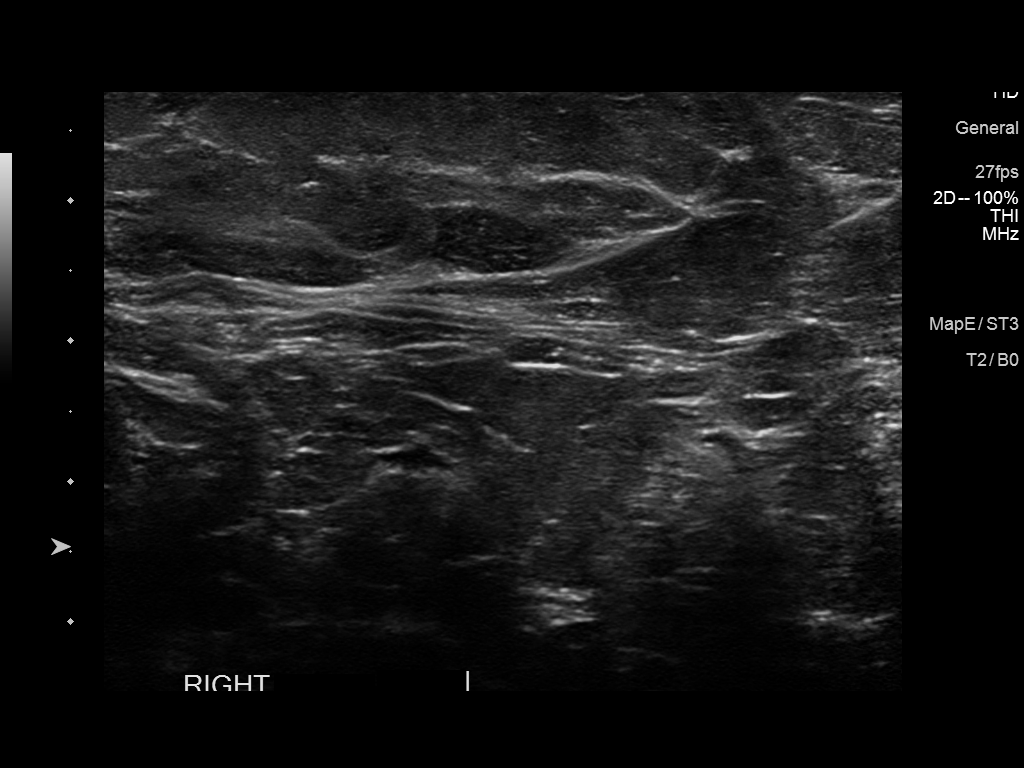
[im 4/4]
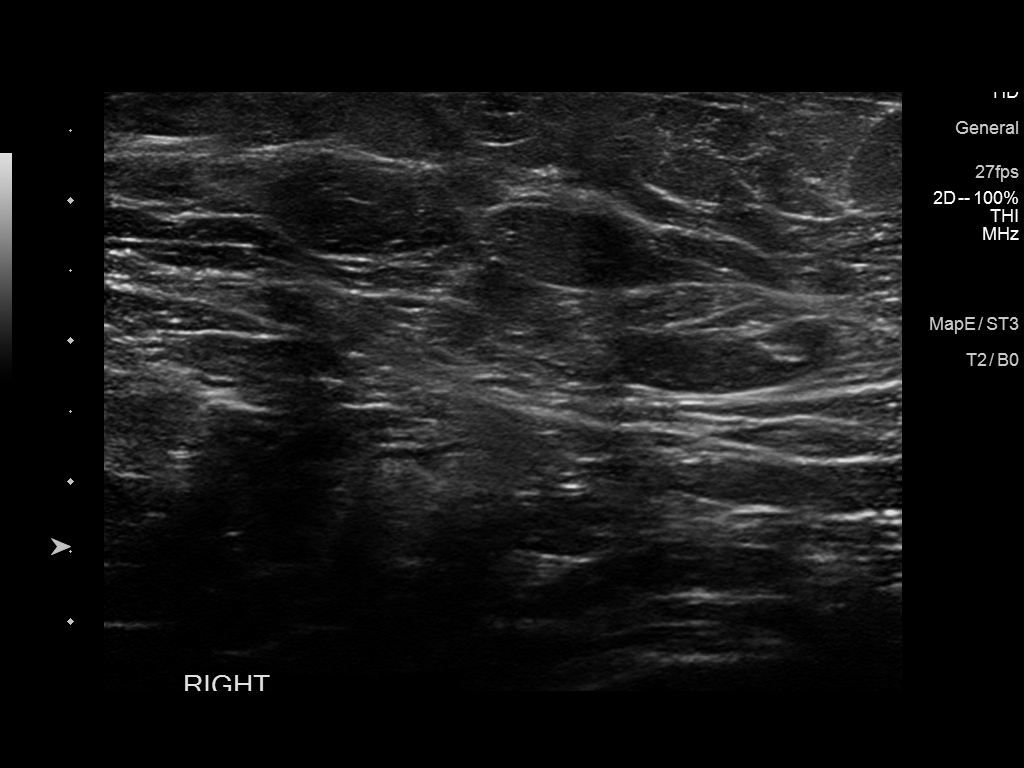

[4 of 4 positions shown; findings below may reference images not displayed]

ACR Breast Density Category b: There are scattered areas of
fibroglandular density.
FINDINGS: 2D and 3D spot compression views of both breasts were obtained.
These confirm an area of architectural distortion in the upper outer
quadrant of the right breast in the middle 3rd. These also confirm
an oval, circumscribed mass in the lower outer quadrant of the left
breast anteriorly. There are several additional, smaller, rounded
and oval circumscribed masses in that region of the breast.

On physical exam, no mass is palpable in either breast or either
axilla.

Targeted ultrasound is performed, showing normal appearing breast
tissue in the upper outer breast with no sonographically visible
distortion or mass.

Ultrasound of the right axilla demonstrated normal appearing right
axillary lymph nodes.

Multiple cysts are demonstrated in the lower outer quadrant of the
left breast, measuring up to 9 mm in maximum diameter each. No solid
masses were seen.
IMPRESSION: 1. Architectural distortion in the upper outer right breast with no
sonographic correlate. This is suspicious for malignancy or a
complex sclerosing lesion.
2. Multiple left breast cysts.

RECOMMENDATION:
3D stereotactic guided core needle biopsy of the area of
architectural distortion in the upper outer right breast. This has
been discussed with the patient and will be scheduled in
consultation with Dr. Ferienhaus.

I have discussed the findings and recommendations with the patient.
Results were also provided in writing at the conclusion of the
visit. If applicable, a reminder letter will be sent to the patient
regarding the next appointment.

BI-RADS CATEGORY  4: Suspicious.

## 2023-10-20 ENCOUNTER — Emergency Department

## 2023-10-20 ENCOUNTER — Emergency Department
Admission: EM | Admit: 2023-10-20 | Discharge: 2023-10-20 | Disposition: A | Discharge status: Home / Self Care | Attending: Emergency Medicine | Admitting: Emergency Medicine

## 2023-10-20 ENCOUNTER — Other Ambulatory Visit: Payer: Self-pay

## 2023-10-20 DIAGNOSIS — Z853 Personal history of malignant neoplasm of breast: Secondary | ICD-10-CM | POA: Insufficient documentation

## 2023-10-20 DIAGNOSIS — H5509 Other forms of nystagmus: Secondary | ICD-10-CM | POA: Insufficient documentation

## 2023-10-20 DIAGNOSIS — R112 Nausea with vomiting, unspecified: Secondary | ICD-10-CM | POA: Diagnosis not present

## 2023-10-20 DIAGNOSIS — R42 Dizziness and giddiness: Secondary | ICD-10-CM | POA: Insufficient documentation

## 2023-10-20 HISTORY — DX: Hyperlipidemia, unspecified: E78.5

## 2023-10-20 LAB — TROPONIN I (HIGH SENSITIVITY)
Troponin I (High Sensitivity): <2 ng/L (ref <18)
Troponin I (High Sensitivity): 4 ng/L (ref <18)

## 2023-10-20 LAB — CBC WITH DIFFERENTIAL/PLATELET
Abs Immature Granulocytes: 0.03 10*3/uL (ref 0.00–0.07)
Basophils Absolute: 0 10*3/uL (ref 0.0–0.1)
Basophils Relative: 0 %
Eosinophils Absolute: 0.1 10*3/uL (ref 0.0–0.5)
Eosinophils Relative: 1 %
HCT: 41.6 % (ref 36.0–46.0)
Hemoglobin: 13.9 g/dL (ref 12.0–15.0)
Immature Granulocytes: 0 %
Lymphocytes Relative: 13 %
Lymphs Abs: 0.9 10*3/uL (ref 0.7–4.0)
MCH: 30 pg (ref 26.0–34.0)
MCHC: 33.4 g/dL (ref 30.0–36.0)
MCV: 89.8 fL (ref 80.0–100.0)
Monocytes Absolute: 0.4 10*3/uL (ref 0.1–1.0)
Monocytes Relative: 5 %
Neutro Abs: 5.5 10*3/uL (ref 1.7–7.7)
Neutrophils Relative %: 81 %
Platelets: 188 10*3/uL (ref 150–400)
RBC: 4.63 MIL/uL (ref 3.87–5.11)
RDW: 12.2 % (ref 11.5–15.5)
WBC: 6.8 10*3/uL (ref 4.0–10.5)
nRBC: 0 % (ref 0.0–0.2)

## 2023-10-20 LAB — URINALYSIS, W/ REFLEX TO CULTURE (INFECTION SUSPECTED)
Bilirubin Urine: NEGATIVE
Glucose, UA: NEGATIVE mg/dL
Hgb urine dipstick: NEGATIVE
Ketones, ur: NEGATIVE mg/dL
Leukocytes,Ua: NEGATIVE
Nitrite: NEGATIVE
Protein, ur: NEGATIVE mg/dL
Specific Gravity, Urine: 1.016 (ref 1.005–1.030)
pH: 8 (ref 5.0–8.0)

## 2023-10-20 LAB — COMPREHENSIVE METABOLIC PANEL WITH GFR
ALT: 27 U/L (ref 0–44)
AST: 23 U/L (ref 15–41)
Albumin: 4 g/dL (ref 3.5–5.0)
Alkaline Phosphatase: 57 U/L (ref 38–126)
Anion gap: 11 (ref 5–15)
BUN: 15 mg/dL (ref 8–23)
CO2: 22 mmol/L (ref 22–32)
Calcium: 9 mg/dL (ref 8.9–10.3)
Chloride: 107 mmol/L (ref 98–111)
Creatinine, Ser: 0.87 mg/dL (ref 0.44–1.00)
GFR, Estimated: >60 mL/min (ref >60)
Glucose, Bld: 161 mg/dL — ABNORMAL HIGH (ref 70–99)
Potassium: 4.1 mmol/L (ref 3.5–5.1)
Sodium: 140 mmol/L (ref 135–145)
Total Bilirubin: 0.4 mg/dL (ref 0.0–1.2)
Total Protein: 6.6 g/dL (ref 6.5–8.1)

## 2023-10-20 MED ORDER — SODIUM CHLORIDE 0.9 % IV SOLN
12.5000 mg | Freq: Once | INTRAVENOUS | Status: AC
  Administered 2023-10-20: 12.5 mg via INTRAVENOUS
  Filled 2023-10-20: qty 0.5

## 2023-10-20 MED ORDER — ONDANSETRON 8 MG PO TBDP: 8.0000 mg | ORAL_TABLET | Freq: Three times a day (TID) | ORAL | 0 refills | Status: AC | PRN

## 2023-10-20 MED ORDER — MECLIZINE HCL 25 MG PO TABS
50.0000 mg | ORAL_TABLET | Freq: Once | ORAL | Status: AC
  Administered 2023-10-20: 50 mg via ORAL
  Filled 2023-10-20: qty 2

## 2023-10-20 MED ORDER — ONDANSETRON HCL 4 MG/2ML IJ SOLN
4.0000 mg | Freq: Once | INTRAMUSCULAR | Status: AC
  Administered 2023-10-20: 4 mg via INTRAVENOUS
  Filled 2023-10-20: qty 2

## 2023-10-20 MED ORDER — MECLIZINE HCL 25 MG PO TABS: 25.0000 mg | ORAL_TABLET | Freq: Three times a day (TID) | ORAL | 0 refills | Status: AC | PRN

## 2023-10-20 NOTE — ED Provider Notes (Addendum)
 [facility] Provider Note   Event Date/Time   First MD Initiated Contact with Patient 10/20/23 0818     (approximate) History  Dizziness  HPI Rhonda Schneider is a 63 y.o. female with a past medical history of hyperlipidemia and breast cancer currently in remission who presents via EMS for vertiginous symptoms including a spinning feeling and significant nausea with vomiting.  EMS noted patient had an episode of bladder incontinence with foul odor.  Patient was given 4 mg of Zofran  with cessation of vomiting throughout transport however patient had another episode upon arrival.  Patient denies any symptoms similar to this in the past.  Patient endorses worsening symptoms with any head movement. ROS: Patient currently denies any vision changes, tinnitus, difficulty speaking, facial droop, sore throat, chest pain, shortness of breath, abdominal pain, nausea/vomiting/diarrhea, dysuria, or weakness/numbness/paresthesias in any extremity   Physical Exam  Triage Vital Signs: ED Triage Vitals  Encounter Vitals Group     BP      Girls Systolic BP Percentile      Girls Diastolic BP Percentile      Boys Systolic BP Percentile      Boys Diastolic BP Percentile      Pulse      Resp      Temp      Temp src      SpO2      Weight      Height      Head Circumference      Peak Flow      Pain Score      Pain Loc      Pain Education      Exclude from Growth Chart    Most recent vital signs: Vitals:   10/20/23 0930 10/20/23 1000  BP: 139/75 126/75  Pulse: 76 78  Resp: 15 18  SpO2: 99% 100%   General: Awake, oriented x4. CV:  Good peripheral perfusion. Resp:  Normal effort. Abd:  No distention. Other:  Middle-aged obese Caucasian female resting on stretcher moving all limbs spontaneously in moderate distress secondary to nausea and vomiting.  Horizontal nystagmus with right gaze ED Results / Procedures / Treatments  Labs (all labs ordered are listed, but only  abnormal results are displayed) Labs Reviewed  COMPREHENSIVE METABOLIC PANEL WITH GFR - Abnormal; Notable for the following components:      Result Value   Glucose, Bld 161 (*)    All other components within normal limits  URINALYSIS, W/ REFLEX TO CULTURE (INFECTION SUSPECTED) - Abnormal; Notable for the following components:   Color, Urine YELLOW (*)    APPearance HAZY (*)    Bacteria, UA RARE (*)    All other components within normal limits  CBC WITH DIFFERENTIAL/PLATELET  TROPONIN I (HIGH SENSITIVITY)  TROPONIN I (HIGH SENSITIVITY)   EKG ED ECG REPORT I, Artist MARLA Kerns, the attending physician, personally viewed and interpreted this ECG. Date: 10/20/2023 EKG Time: 0820 Rate: 74 Rhythm: normal sinus rhythm QRS Axis: normal Intervals: normal ST/T Wave abnormalities: normal Narrative Interpretation: no evidence of acute ischemia RADIOLOGY ED MD interpretation: CT of the head without contrast interpreted by me shows no evidence of acute abnormalities including no intracerebral hemorrhage, obvious masses, or significant edema - All radiology independently interpreted and agree with radiology assessment Official radiology report(s): CT Head Wo Contrast Result Date: 10/20/2023 CLINICAL DATA:  Neuro deficit, acute, stroke suspected EXAM: CT HEAD WITHOUT CONTRAST TECHNIQUE: Contiguous axial images were obtained from the base of  the skull through the vertex without intravenous contrast. RADIATION DOSE REDUCTION: This exam was performed according to the departmental dose-optimization program which includes automated exposure control, adjustment of the mA and/or kV according to patient size and/or use of iterative reconstruction technique. COMPARISON:  CT of the head dated October 31, 2015. FINDINGS: Brain: The brain appears normal. No evidence of hemorrhage, mass, cortical infarct or hydrocephalus. Vascular: Negative. Skull: Intact and unremarkable. Sinuses/Orbits: Mild mucosal disease within the  floors of the maxillary sinuses. The paranasal sinuses and mastoid air cells are otherwise clear. Status post bilateral lens replacement. Other: None. IMPRESSION: 1. Normal brain. 2. Mild bilateral maxillary sinus disease. Electronically Signed   By: Evalene Coho M.D.   On: 10/20/2023 08:55   PROCEDURES: Critical Care performed: No .1-3 Lead EKG Interpretation  Performed by: Jossie Artist POUR, MD Authorized by: Jossie Artist POUR, MD     Interpretation: normal     ECG rate:  71   ECG rate assessment: normal     Rhythm: sinus rhythm     Ectopy: none     Conduction: normal    MEDICATIONS ORDERED IN ED: Medications  ondansetron  (ZOFRAN ) injection 4 mg (4 mg Intravenous Given 10/20/23 0831)  meclizine (ANTIVERT) tablet 50 mg (50 mg Oral Given 10/20/23 1024)  promethazine (PHENERGAN) 12.5 mg in sodium chloride  0.9 % 50 mL IVPB (0 mg Intravenous Stopped 10/20/23 1025)   IMPRESSION / MDM / ASSESSMENT AND PLAN / ED COURSE  I reviewed the triage vital signs and the nursing notes.                             The patient is on the cardiac monitor to evaluate for evidence of arrhythmia and/or significant heart rate changes. Patient's presentation is most consistent with acute presentation with potential threat to life or bodily function. Based on History, Exam, and Findings, presentation not consistent with syncope, seizure, stroke, meningitis, symptomatic anemia (gastrointestinal bleed), Increased ICP (cerebral tumor/mass), ICH. Additionally, I have a low suspicion for AOM, labyrinthitis, or other infectious process. Tx: meclizine Reassessment: Prior to discharge symptoms controlled, patient well appearing. Disposition:  Discharge. Strict return precautions discussed w/ full understanding. Advise follow up with primary care provider within 24-48 hours.   FINAL CLINICAL IMPRESSION(S) / ED DIAGNOSES   Final diagnoses:  Vertigo  Nausea and vomiting, unspecified vomiting type   Rx / DC Orders    ED Discharge Orders          Ordered    ondansetron  (ZOFRAN -ODT) 8 MG disintegrating tablet  Every 8 hours PRN        10/20/23 1059    meclizine (ANTIVERT) 25 MG tablet  3 times daily PRN        10/20/23 1059           Note:  This document was prepared using Dragon voice recognition software and may include unintentional dictation errors.   Jossie Artist POUR, MD 10/20/23 1126    Lincoln Kleiner K, MD 10/20/23 1126

## 2023-10-20 NOTE — ED Triage Notes (Signed)
 Pt to ED via [facility] from home for vertigo symptoms. Pt woke up with dizziness and spinning feeling that worsens with position changes. EMS reports pt had episode of bladder incontinence that has foul odor. Pt reports nausea and is dry heaving at this time.   EMS vitals: Cbg 146 HR 70 Temp 97.9 BP 146/80

## 2023-10-20 NOTE — ED Notes (Signed)
 Pt given crackers and giner ale for PO challenge

## 2023-10-20 NOTE — ED Notes (Signed)
 Pt to CT at this time.
